# Patient Record
Sex: Female | Born: 1935 | Race: White | Hispanic: No | Smoking: Former smoker
Health system: Southern US, Community
[De-identification: ages and names within clinical notes are randomized; demographics above are authoritative.]

## PROBLEM LIST (undated history)

## (undated) DIAGNOSIS — M810 Age-related osteoporosis without current pathological fracture: Secondary | ICD-10-CM

## (undated) DIAGNOSIS — M545 Low back pain, unspecified: Secondary | ICD-10-CM

## (undated) DIAGNOSIS — E785 Hyperlipidemia, unspecified: Secondary | ICD-10-CM

## (undated) DIAGNOSIS — F419 Anxiety disorder, unspecified: Secondary | ICD-10-CM

## (undated) DIAGNOSIS — I10 Essential (primary) hypertension: Secondary | ICD-10-CM

## (undated) DIAGNOSIS — F32A Depression, unspecified: Secondary | ICD-10-CM

## (undated) DIAGNOSIS — D649 Anemia, unspecified: Secondary | ICD-10-CM

## (undated) DIAGNOSIS — Z950 Presence of cardiac pacemaker: Secondary | ICD-10-CM

## (undated) DIAGNOSIS — E079 Disorder of thyroid, unspecified: Secondary | ICD-10-CM

## (undated) DIAGNOSIS — F329 Major depressive disorder, single episode, unspecified: Secondary | ICD-10-CM

## (undated) DIAGNOSIS — I509 Heart failure, unspecified: Secondary | ICD-10-CM

## (undated) DIAGNOSIS — I350 Nonrheumatic aortic (valve) stenosis: Secondary | ICD-10-CM

## (undated) HISTORY — PX: OTHER SURGICAL HISTORY: SHX169

## (undated) HISTORY — PX: TOTAL HIP ARTHROPLASTY: SHX124

## (undated) HISTORY — PX: PACEMAKER INSERTION: SHX728

## (undated) HISTORY — PX: INSERT / REPLACE / REMOVE PACEMAKER: SUR710

---

## 2002-06-05 ENCOUNTER — Encounter: Payer: Self-pay | Admitting: Internal Medicine

## 2002-06-05 ENCOUNTER — Ambulatory Visit (HOSPITAL_COMMUNITY): Admission: RE | Admit: 2002-06-05 | Discharge: 2002-06-05 | Payer: Self-pay | Admitting: Internal Medicine

## 2003-07-10 ENCOUNTER — Encounter: Payer: Self-pay | Admitting: Internal Medicine

## 2003-07-10 ENCOUNTER — Ambulatory Visit (HOSPITAL_COMMUNITY): Admission: RE | Admit: 2003-07-10 | Discharge: 2003-07-10 | Payer: Self-pay | Admitting: Internal Medicine

## 2004-12-23 ENCOUNTER — Ambulatory Visit: Payer: Self-pay | Admitting: Internal Medicine

## 2005-06-23 ENCOUNTER — Ambulatory Visit: Payer: Self-pay | Admitting: Internal Medicine

## 2005-10-05 ENCOUNTER — Ambulatory Visit: Payer: Self-pay | Admitting: Internal Medicine

## 2006-03-29 ENCOUNTER — Ambulatory Visit: Payer: Self-pay | Admitting: Internal Medicine

## 2006-09-20 ENCOUNTER — Ambulatory Visit: Payer: Self-pay | Admitting: Internal Medicine

## 2007-01-21 ENCOUNTER — Inpatient Hospital Stay (HOSPITAL_COMMUNITY): Admission: EM | Admit: 2007-01-21 | Discharge: 2007-01-25 | Payer: Self-pay | Admitting: Emergency Medicine

## 2007-01-21 ENCOUNTER — Encounter: Payer: Self-pay | Admitting: Cardiology

## 2007-01-21 ENCOUNTER — Ambulatory Visit: Payer: Self-pay | Admitting: Cardiology

## 2007-02-21 ENCOUNTER — Inpatient Hospital Stay (HOSPITAL_COMMUNITY): Admission: EM | Admit: 2007-02-21 | Discharge: 2007-02-24 | Payer: Self-pay | Admitting: Emergency Medicine

## 2007-02-21 ENCOUNTER — Ambulatory Visit: Payer: Self-pay | Admitting: Cardiology

## 2007-02-22 ENCOUNTER — Ambulatory Visit: Payer: Self-pay | Admitting: Vascular Surgery

## 2007-03-06 ENCOUNTER — Ambulatory Visit: Payer: Self-pay | Admitting: Internal Medicine

## 2007-03-28 ENCOUNTER — Ambulatory Visit: Payer: Self-pay | Admitting: Cardiovascular Disease

## 2007-07-22 DIAGNOSIS — E039 Hypothyroidism, unspecified: Secondary | ICD-10-CM | POA: Insufficient documentation

## 2007-07-22 DIAGNOSIS — H4089 Other specified glaucoma: Secondary | ICD-10-CM

## 2007-07-22 DIAGNOSIS — Z9189 Other specified personal risk factors, not elsewhere classified: Secondary | ICD-10-CM | POA: Insufficient documentation

## 2007-07-22 DIAGNOSIS — F411 Generalized anxiety disorder: Secondary | ICD-10-CM

## 2007-07-22 DIAGNOSIS — I10 Essential (primary) hypertension: Secondary | ICD-10-CM | POA: Insufficient documentation

## 2007-07-22 DIAGNOSIS — M810 Age-related osteoporosis without current pathological fracture: Secondary | ICD-10-CM | POA: Insufficient documentation

## 2007-07-22 DIAGNOSIS — R55 Syncope and collapse: Secondary | ICD-10-CM

## 2007-09-06 ENCOUNTER — Ambulatory Visit: Payer: Self-pay | Admitting: Internal Medicine

## 2007-09-06 LAB — CONVERTED CEMR LAB: TSH: 2.21 microintl units/mL (ref 0.35–5.50)

## 2008-02-23 ENCOUNTER — Telehealth: Payer: Self-pay | Admitting: Internal Medicine

## 2008-04-09 ENCOUNTER — Ambulatory Visit: Payer: Self-pay | Admitting: Internal Medicine

## 2008-04-09 DIAGNOSIS — R5381 Other malaise: Secondary | ICD-10-CM

## 2008-04-09 DIAGNOSIS — R5383 Other fatigue: Secondary | ICD-10-CM

## 2008-04-09 DIAGNOSIS — D649 Anemia, unspecified: Secondary | ICD-10-CM

## 2008-04-09 DIAGNOSIS — G4700 Insomnia, unspecified: Secondary | ICD-10-CM

## 2008-04-10 ENCOUNTER — Encounter: Payer: Self-pay | Admitting: Internal Medicine

## 2008-04-10 DIAGNOSIS — E538 Deficiency of other specified B group vitamins: Secondary | ICD-10-CM

## 2008-04-10 LAB — CONVERTED CEMR LAB
ALT: 22 units/L (ref 0–35)
AST: 24 units/L (ref 0–37)
Albumin: 4.1 g/dL (ref 3.5–5.2)
BUN: 11 mg/dL (ref 6–23)
Bilirubin, Direct: 0.1 mg/dL (ref 0.0–0.3)
Chloride: 103 meq/L (ref 96–112)
Creatinine, Ser: 0.6 mg/dL (ref 0.4–1.2)
Eosinophils Relative: 0.9 % (ref 0.0–5.0)
Folate: >20 ng/mL
Hemoglobin: 13.9 g/dL (ref 12.0–15.0)
LDL Cholesterol: 83 mg/dL (ref 0–99)
MCV: 92.8 fL (ref 78.0–100.0)
Neutrophils Relative %: 76.7 % (ref 43.0–77.0)
Platelets: 199 10*3/uL (ref 150–400)
RBC: 4.49 M/uL (ref 3.87–5.11)
RDW: 12.4 % (ref 11.5–14.6)
Saturation Ratios: 20 % (ref 20.0–50.0)
TSH: 1.71 microintl units/mL (ref 0.35–5.50)
Total Bilirubin: 0.6 mg/dL (ref 0.3–1.2)
Total CHOL/HDL Ratio: 2.4
Total Protein: 7.3 g/dL (ref 6.0–8.3)
Transferrin: 321.7 mg/dL (ref >=212.0)

## 2008-04-12 ENCOUNTER — Telehealth: Payer: Self-pay | Admitting: Internal Medicine

## 2008-06-25 ENCOUNTER — Encounter: Payer: Self-pay | Admitting: Internal Medicine

## 2008-08-15 ENCOUNTER — Telehealth (INDEPENDENT_AMBULATORY_CARE_PROVIDER_SITE_OTHER): Payer: Self-pay | Admitting: *Deleted

## 2008-10-03 ENCOUNTER — Telehealth (INDEPENDENT_AMBULATORY_CARE_PROVIDER_SITE_OTHER): Payer: Self-pay | Admitting: *Deleted

## 2008-10-08 ENCOUNTER — Ambulatory Visit: Payer: Self-pay | Admitting: Internal Medicine

## 2009-01-24 ENCOUNTER — Telehealth (INDEPENDENT_AMBULATORY_CARE_PROVIDER_SITE_OTHER): Payer: Self-pay | Admitting: *Deleted

## 2009-03-24 ENCOUNTER — Telehealth (INDEPENDENT_AMBULATORY_CARE_PROVIDER_SITE_OTHER): Payer: Self-pay | Admitting: *Deleted

## 2009-04-08 ENCOUNTER — Ambulatory Visit: Payer: Self-pay | Admitting: Internal Medicine

## 2009-04-08 LAB — CONVERTED CEMR LAB
Alkaline Phosphatase: 80 units/L (ref 39–117)
BUN: 9 mg/dL (ref 6–23)
Basophils Relative: 0.2 % (ref 0.0–3.0)
CO2: 32 meq/L (ref 19–32)
Calcium: 9.7 mg/dL (ref 8.4–10.5)
Chloride: 102 meq/L (ref 96–112)
Creatinine, Ser: 0.6 mg/dL (ref 0.4–1.2)
Glucose, Bld: 113 mg/dL — ABNORMAL HIGH (ref 70–99)
HCT: 40.3 % (ref 36.0–46.0)
Hemoglobin, Urine: NEGATIVE
Hemoglobin: 14 g/dL (ref 12.0–15.0)
Leukocytes, UA: NEGATIVE
MCHC: 34.8 g/dL (ref 30.0–36.0)
Nitrite: NEGATIVE
Platelets: 188 10*3/uL (ref 150.0–400.0)
Potassium: 3.5 meq/L (ref 3.5–5.1)
RBC: 4.43 M/uL (ref 3.87–5.11)
Sodium: 142 meq/L (ref 135–145)
Total Bilirubin: 0.9 mg/dL (ref 0.3–1.2)
Total CHOL/HDL Ratio: 2
Triglycerides: 48 mg/dL (ref 0.0–149.0)
Urine Glucose: NEGATIVE mg/dL
Urobilinogen, UA: 0.2 (ref 0.0–1.0)
VLDL: 9.6 mg/dL (ref 0.0–40.0)
WBC: 8.9 10*3/uL (ref 4.5–10.5)

## 2009-07-16 ENCOUNTER — Ambulatory Visit: Payer: Self-pay | Admitting: Endocrinology

## 2009-07-16 ENCOUNTER — Ambulatory Visit: Payer: Self-pay | Admitting: Cardiology

## 2009-07-16 ENCOUNTER — Inpatient Hospital Stay (HOSPITAL_COMMUNITY): Admission: EM | Admit: 2009-07-16 | Discharge: 2009-07-21 | Payer: Self-pay | Admitting: Emergency Medicine

## 2009-07-17 ENCOUNTER — Encounter (INDEPENDENT_AMBULATORY_CARE_PROVIDER_SITE_OTHER): Payer: Self-pay | Admitting: Internal Medicine

## 2009-07-18 ENCOUNTER — Encounter (INDEPENDENT_AMBULATORY_CARE_PROVIDER_SITE_OTHER): Payer: Self-pay | Admitting: Internal Medicine

## 2009-07-18 ENCOUNTER — Ambulatory Visit: Payer: Self-pay | Admitting: Vascular Surgery

## 2009-08-11 ENCOUNTER — Ambulatory Visit: Payer: Self-pay | Admitting: Internal Medicine

## 2009-08-11 DIAGNOSIS — R509 Fever, unspecified: Secondary | ICD-10-CM

## 2009-08-11 LAB — CONVERTED CEMR LAB
Hemoglobin, Urine: NEGATIVE
Ketones, ur: NEGATIVE mg/dL
Specific Gravity, Urine: 1.02 (ref 1.000–1.030)
Total Protein, Urine: NEGATIVE mg/dL
Urine Glucose: NEGATIVE mg/dL
Urobilinogen, UA: 0.2 (ref 0.0–1.0)
pH: 5.5 (ref 5.0–8.0)

## 2009-08-12 ENCOUNTER — Telehealth: Payer: Self-pay | Admitting: Internal Medicine

## 2009-08-12 ENCOUNTER — Encounter: Payer: Self-pay | Admitting: Internal Medicine

## 2009-08-15 ENCOUNTER — Telehealth: Payer: Self-pay | Admitting: Internal Medicine

## 2009-08-16 DIAGNOSIS — D638 Anemia in other chronic diseases classified elsewhere: Secondary | ICD-10-CM

## 2009-08-16 DIAGNOSIS — I5021 Acute systolic (congestive) heart failure: Secondary | ICD-10-CM | POA: Insufficient documentation

## 2009-08-16 DIAGNOSIS — I498 Other specified cardiac arrhythmias: Secondary | ICD-10-CM

## 2009-08-21 ENCOUNTER — Encounter: Payer: Self-pay | Admitting: Internal Medicine

## 2009-08-21 ENCOUNTER — Telehealth: Payer: Self-pay | Admitting: Internal Medicine

## 2009-08-25 ENCOUNTER — Ambulatory Visit: Payer: Self-pay | Admitting: Internal Medicine

## 2009-09-02 ENCOUNTER — Telehealth: Payer: Self-pay | Admitting: Internal Medicine

## 2009-09-15 ENCOUNTER — Telehealth: Payer: Self-pay | Admitting: Internal Medicine

## 2009-09-24 ENCOUNTER — Telehealth: Payer: Self-pay | Admitting: Internal Medicine

## 2009-09-29 ENCOUNTER — Encounter: Payer: Self-pay | Admitting: Cardiovascular Disease

## 2009-09-29 ENCOUNTER — Emergency Department (HOSPITAL_COMMUNITY): Admission: EM | Admit: 2009-09-29 | Discharge: 2009-09-29 | Payer: Self-pay | Admitting: Emergency Medicine

## 2009-09-30 ENCOUNTER — Telehealth: Payer: Self-pay | Admitting: Cardiovascular Disease

## 2009-10-06 ENCOUNTER — Inpatient Hospital Stay (HOSPITAL_COMMUNITY): Admission: EM | Admit: 2009-10-06 | Discharge: 2009-10-09 | Payer: Self-pay | Admitting: Emergency Medicine

## 2009-10-06 ENCOUNTER — Telehealth: Payer: Self-pay | Admitting: Internal Medicine

## 2009-10-10 ENCOUNTER — Telehealth: Payer: Self-pay | Admitting: Internal Medicine

## 2009-10-15 ENCOUNTER — Encounter (INDEPENDENT_AMBULATORY_CARE_PROVIDER_SITE_OTHER): Payer: Self-pay | Admitting: *Deleted

## 2009-10-16 ENCOUNTER — Ambulatory Visit: Payer: Self-pay | Admitting: Cardiovascular Disease

## 2009-10-16 ENCOUNTER — Ambulatory Visit: Payer: Self-pay | Admitting: Internal Medicine

## 2009-11-03 DIAGNOSIS — I359 Nonrheumatic aortic valve disorder, unspecified: Secondary | ICD-10-CM | POA: Insufficient documentation

## 2011-01-17 IMAGING — CR DG LUMBAR SPINE COMPLETE 4+V
7 series · 7 of 7 positions shown · non-contrast
Comparison: Chest radiograph 08/11/2009 and 09/29/2009

CLINICAL DATA: Syncope.  Lower back pain.

LUMBAR SPINE - COMPLETE 4+ VIEW

[t l-spine a.p.]
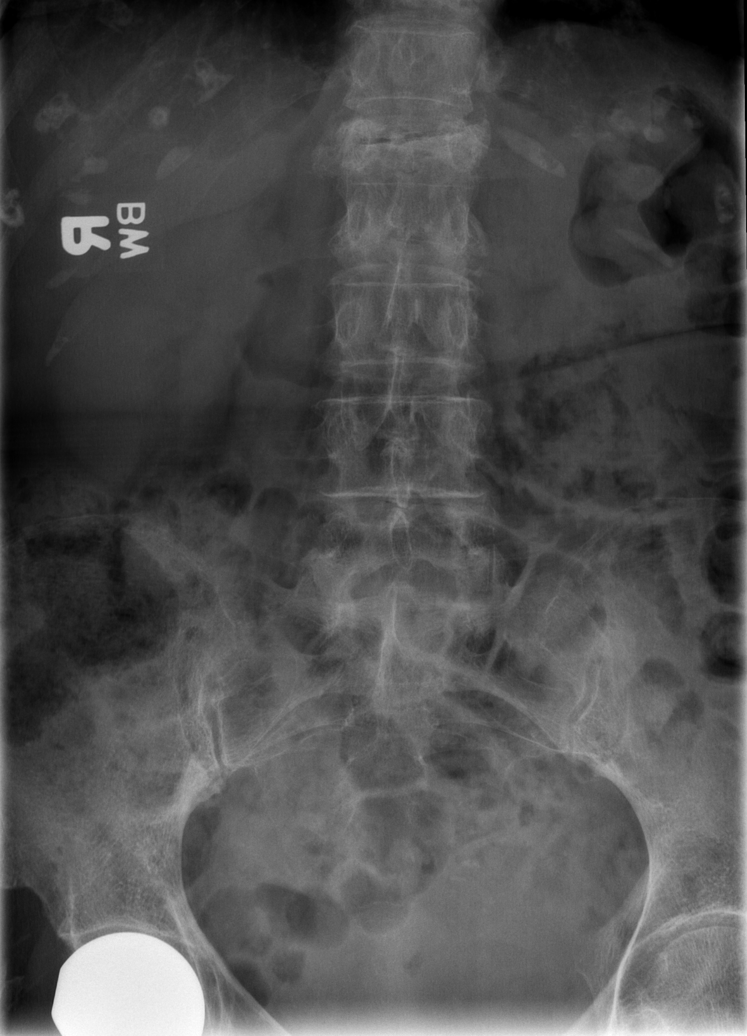

[t l-spine oblique exposure (1 of 3)]
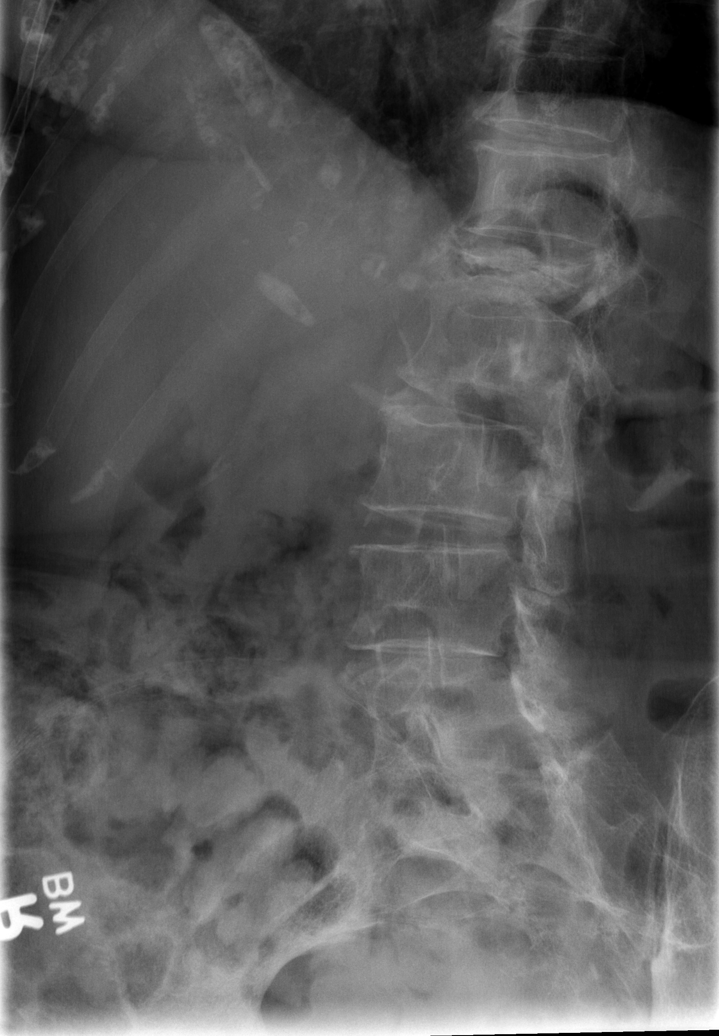

[t l-spine oblique exposure (2 of 3)]
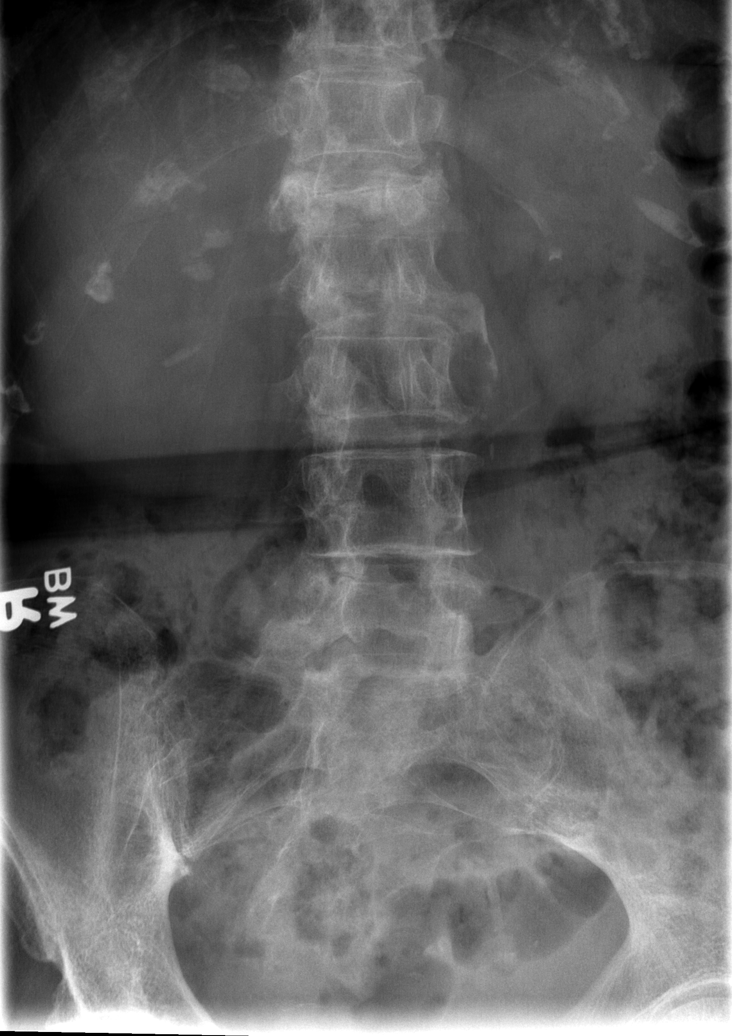

[t l-spine oblique exposure (3 of 3)]
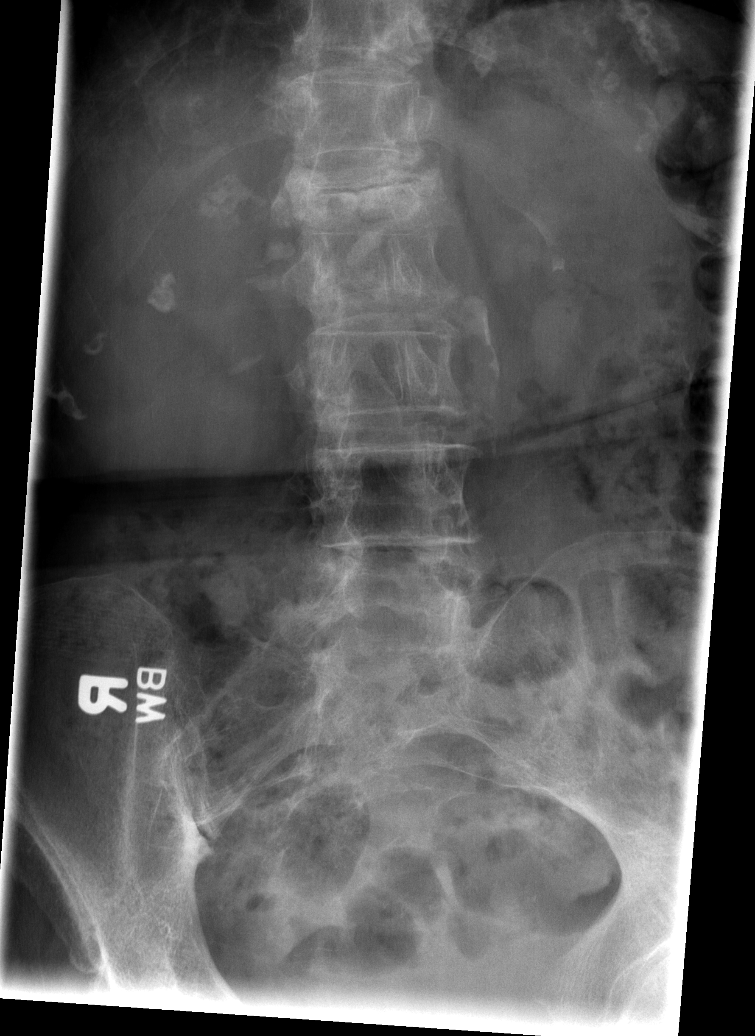

[t l-spine lat (1 of 2)]
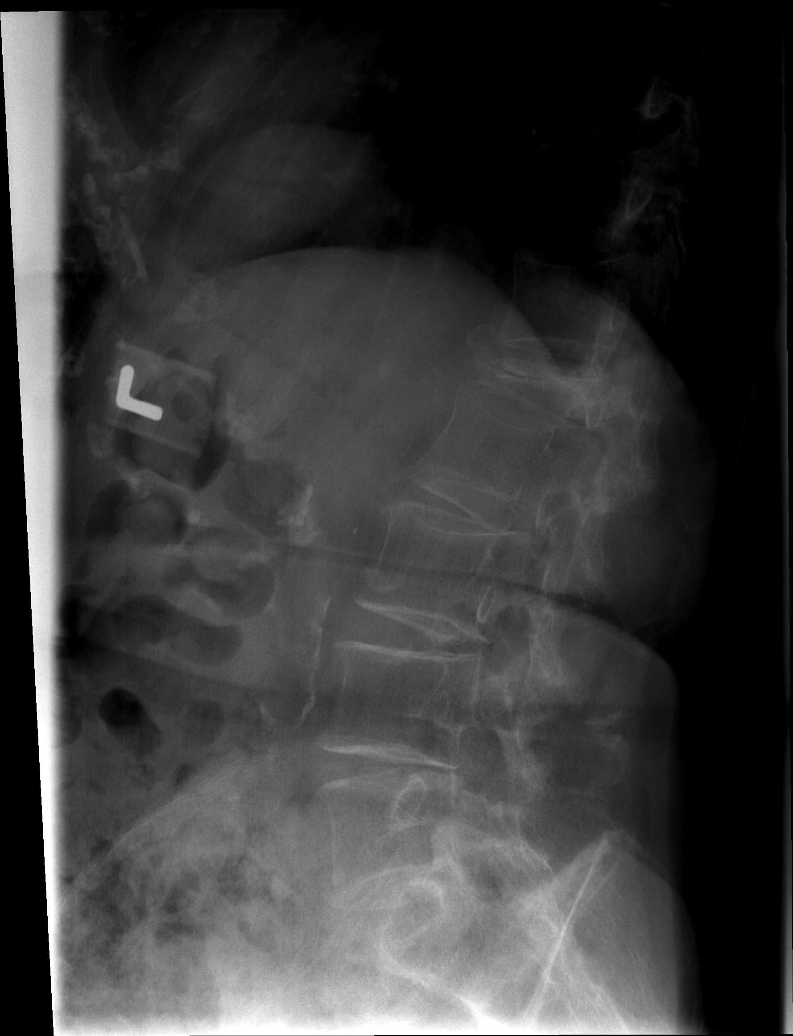

[t l-spine l5-s1 spot]
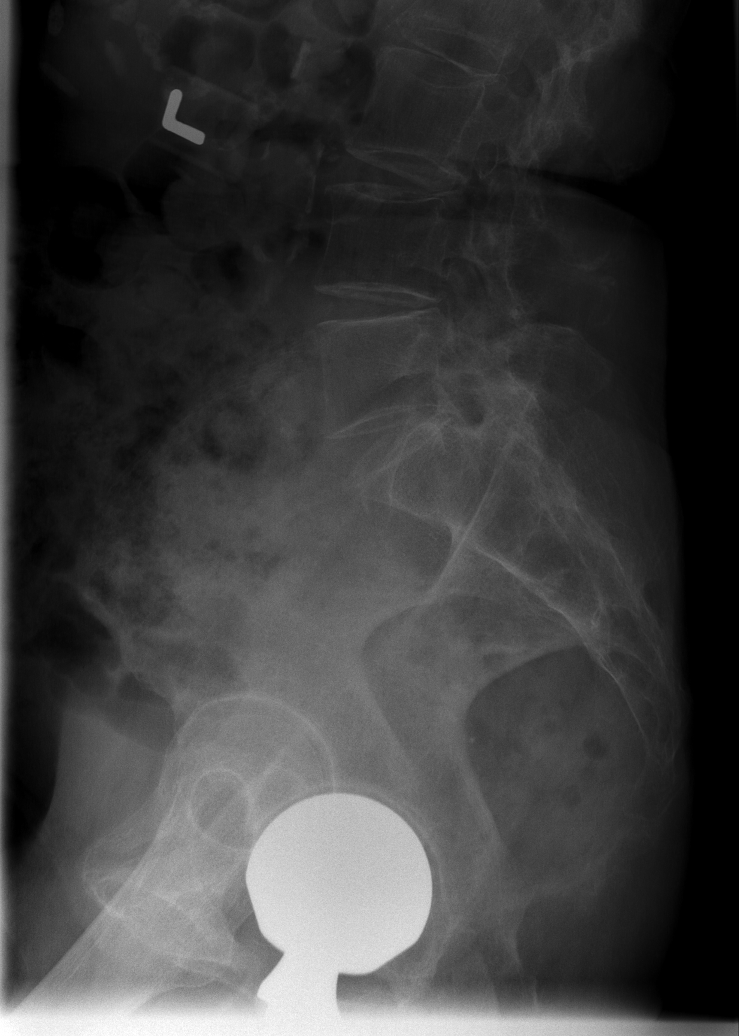

[t l-spine lat (2 of 2)]
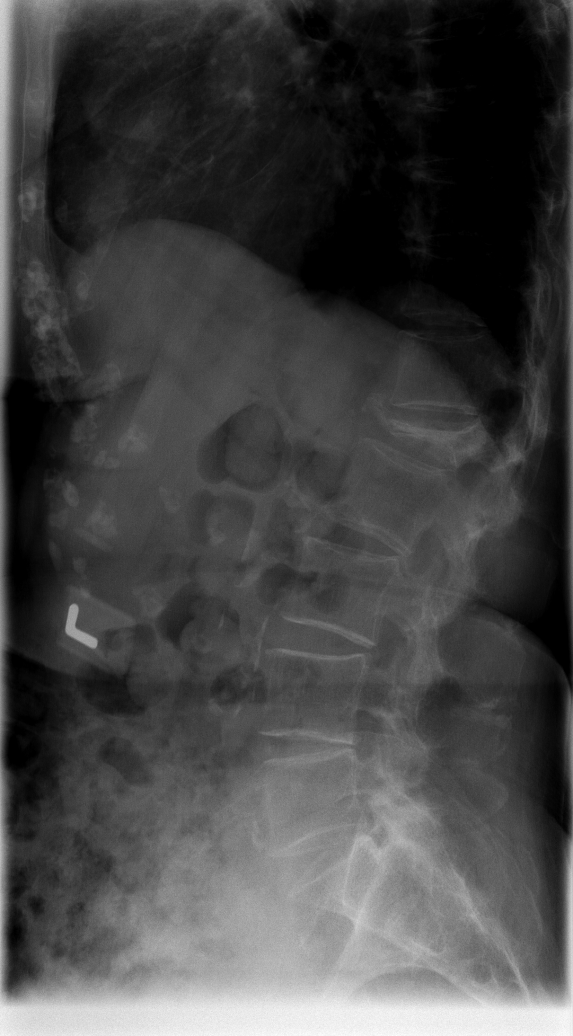

[7 of 7 positions shown; findings below may reference images not displayed]

FINDINGS: There are five lumbar-type vertebral bodies, and normal
alignment.  Bony mineralization is markedly decreased.  There is a
severe compression deformity of the L1 vertebral body, with
approximately 80-90% loss of vertebral body height.  This fracture
appears without interval change compared to the lateral view of the
chest radiograph 08/11/2009.  The remainder of the vertebral bodies
are normal in height.  There is mild disc space narrowing at L4-L5.

There is atherosclerotic calcification of the abdominal aorta.
There are mild facet joint degenerative changes at the L4-5 and L5-
S1.

Right hip arthroplasty is partially visualized.
IMPRESSION: 1.  Severe compression deformity of L1.  This appears similar
compared to a chest radiograph July 2009.
2.  Diffusely decreased bony mineralization.  Three
3.  Atherosclerotic calcification of the abdominal aorta.

## 2011-03-18 LAB — CBC
HCT: 34.5 % — ABNORMAL LOW (ref 36.0–46.0)
HCT: 36.5 % (ref 36.0–46.0)
HCT: 39 % (ref 36.0–46.0)
Hemoglobin: 11.7 g/dL — ABNORMAL LOW (ref 12.0–15.0)
Hemoglobin: 12.2 g/dL (ref 12.0–15.0)
Hemoglobin: 12.5 g/dL (ref 12.0–15.0)
Hemoglobin: 13 g/dL (ref 12.0–15.0)
MCHC: 33.2 g/dL (ref 30.0–36.0)
MCHC: 33.4 g/dL (ref 30.0–36.0)
MCHC: 33.5 g/dL (ref 30.0–36.0)
MCV: 90.6 fL (ref 78.0–100.0)
Platelets: 199 10*3/uL (ref 150–400)
RBC: 3.86 MIL/uL — ABNORMAL LOW (ref 3.87–5.11)
RBC: 4.16 MIL/uL (ref 3.87–5.11)
RDW: 15.2 % (ref 11.5–15.5)
RDW: 15.3 % (ref 11.5–15.5)
RDW: 15.4 % (ref 11.5–15.5)
RDW: 15.5 % (ref 11.5–15.5)
WBC: 10.3 10*3/uL (ref 4.0–10.5)
WBC: 12.4 10*3/uL — ABNORMAL HIGH (ref 4.0–10.5)
WBC: 14.9 10*3/uL — ABNORMAL HIGH (ref 4.0–10.5)

## 2011-03-18 LAB — BASIC METABOLIC PANEL
CO2: 26 mEq/L (ref 19–32)
Calcium: 8.7 mg/dL (ref 8.4–10.5)
Calcium: 9.3 mg/dL (ref 8.4–10.5)
Calcium: 9.6 mg/dL (ref 8.4–10.5)
Chloride: 100 mEq/L (ref 96–112)
Chloride: 97 mEq/L (ref 96–112)
Creatinine, Ser: 0.47 mg/dL (ref 0.4–1.2)
Creatinine, Ser: 0.61 mg/dL (ref 0.4–1.2)
GFR calc Af Amer: >60 mL/min (ref >60)
GFR calc Af Amer: >60 mL/min (ref >60)
GFR calc Af Amer: >60 mL/min (ref >60)
GFR calc Af Amer: >60 mL/min (ref >60)
GFR calc non Af Amer: >60 mL/min (ref >60)
GFR calc non Af Amer: >60 mL/min (ref >60)
GFR calc non Af Amer: >60 mL/min (ref >60)
GFR calc non Af Amer: >60 mL/min (ref >60)
Glucose, Bld: 108 mg/dL — ABNORMAL HIGH (ref 70–99)
Glucose, Bld: 124 mg/dL — ABNORMAL HIGH (ref 70–99)
Glucose, Bld: 165 mg/dL — ABNORMAL HIGH (ref 70–99)
Potassium: 3.7 mEq/L (ref 3.5–5.1)
Potassium: 3.7 mEq/L (ref 3.5–5.1)
Potassium: 4 mEq/L (ref 3.5–5.1)
Sodium: 131 mEq/L — ABNORMAL LOW (ref 135–145)
Sodium: 134 mEq/L — ABNORMAL LOW (ref 135–145)
Sodium: 134 mEq/L — ABNORMAL LOW (ref 135–145)
Sodium: 138 mEq/L (ref 135–145)

## 2011-03-18 LAB — DIFFERENTIAL
Basophils Absolute: 0 10*3/uL (ref 0.0–0.1)
Basophils Absolute: 0 10*3/uL (ref 0.0–0.1)
Basophils Absolute: 0 10*3/uL (ref 0.0–0.1)
Basophils Absolute: 0 10*3/uL (ref 0.0–0.1)
Basophils Absolute: 0 10*3/uL (ref 0.0–0.1)
Basophils Relative: 0 % (ref 0–1)
Basophils Relative: 0 % (ref 0–1)
Basophils Relative: 0 % (ref 0–1)
Basophils Relative: 0 % (ref 0–1)
Eosinophils Absolute: 0 10*3/uL (ref 0.0–0.7)
Eosinophils Relative: 0 % (ref 0–5)
Eosinophils Relative: 0 % (ref 0–5)
Eosinophils Relative: 2 % (ref 0–5)
Lymphocytes Relative: 3 % — ABNORMAL LOW (ref 12–46)
Lymphocytes Relative: 8 % — ABNORMAL LOW (ref 12–46)
Lymphs Abs: 0.3 10*3/uL — ABNORMAL LOW (ref 0.7–4.0)
Lymphs Abs: 0.9 10*3/uL (ref 0.7–4.0)
Monocytes Absolute: 1.3 10*3/uL — ABNORMAL HIGH (ref 0.1–1.0)
Monocytes Absolute: 1.3 10*3/uL — ABNORMAL HIGH (ref 0.1–1.0)
Monocytes Absolute: 1.3 10*3/uL — ABNORMAL HIGH (ref 0.1–1.0)
Monocytes Relative: 11 % (ref 3–12)
Monocytes Relative: 7 % (ref 3–12)
Monocytes Relative: 8 % (ref 3–12)
Neutro Abs: 10 10*3/uL — ABNORMAL HIGH (ref 1.7–7.7)
Neutro Abs: 14.6 10*3/uL — ABNORMAL HIGH (ref 1.7–7.7)
Neutro Abs: 8.5 10*3/uL — ABNORMAL HIGH (ref 1.7–7.7)
Neutrophils Relative %: 81 % — ABNORMAL HIGH (ref 43–77)
Neutrophils Relative %: 91 % — ABNORMAL HIGH (ref 43–77)

## 2011-03-18 LAB — COMPREHENSIVE METABOLIC PANEL
ALT: 17 U/L (ref 0–35)
Alkaline Phosphatase: 51 U/L (ref 39–117)
BUN: 7 mg/dL (ref 6–23)
Chloride: 97 mEq/L (ref 96–112)
Glucose, Bld: 97 mg/dL (ref 70–99)
Potassium: 3.2 mEq/L — ABNORMAL LOW (ref 3.5–5.1)
Sodium: 131 mEq/L — ABNORMAL LOW (ref 135–145)
Total Bilirubin: 0.9 mg/dL (ref 0.3–1.2)
Total Protein: 5.9 g/dL — ABNORMAL LOW (ref 6.0–8.3)

## 2011-03-18 LAB — POCT CARDIAC MARKERS
CKMB, poc: 1.1 ng/mL (ref 1.0–8.0)
CKMB, poc: 2.2 ng/mL (ref 1.0–8.0)
Myoglobin, poc: 134 ng/mL (ref 12–200)
Myoglobin, poc: 43 ng/mL (ref 12–200)

## 2011-03-18 LAB — URINE CULTURE

## 2011-03-18 LAB — URINALYSIS, ROUTINE W REFLEX MICROSCOPIC
Bilirubin Urine: NEGATIVE
Glucose, UA: NEGATIVE mg/dL
Hgb urine dipstick: NEGATIVE
Ketones, ur: NEGATIVE mg/dL
Protein, ur: NEGATIVE mg/dL
Urobilinogen, UA: 0.2 mg/dL (ref 0.0–1.0)

## 2011-03-18 LAB — TSH: TSH: 1.712 u[IU]/mL (ref 0.350–4.500)

## 2011-03-20 LAB — COMPREHENSIVE METABOLIC PANEL
ALT: 33 U/L (ref 0–35)
AST: 25 U/L (ref 0–37)
Albumin: 2.7 g/dL — ABNORMAL LOW (ref 3.5–5.2)
Albumin: 3.3 g/dL — ABNORMAL LOW (ref 3.5–5.2)
Alkaline Phosphatase: 61 U/L (ref 39–117)
BUN: 18 mg/dL (ref 6–23)
Calcium: 8.4 mg/dL (ref 8.4–10.5)
Creatinine, Ser: 0.82 mg/dL (ref 0.4–1.2)
GFR calc Af Amer: >60 mL/min (ref >60)
Glucose, Bld: 98 mg/dL (ref 70–99)
Potassium: 4 mEq/L (ref 3.5–5.1)
Sodium: 129 mEq/L — ABNORMAL LOW (ref 135–145)
Total Protein: 5.6 g/dL — ABNORMAL LOW (ref 6.0–8.3)
Total Protein: 6.4 g/dL (ref 6.0–8.3)

## 2011-03-20 LAB — TROPONIN I: Troponin I: 0.29 ng/mL — ABNORMAL HIGH (ref 0.00–0.06)

## 2011-03-20 LAB — CULTURE, BLOOD (ROUTINE X 2): Culture: NO GROWTH

## 2011-03-20 LAB — BASIC METABOLIC PANEL
BUN: 11 mg/dL (ref 6–23)
BUN: 11 mg/dL (ref 6–23)
BUN: 13 mg/dL (ref 6–23)
CO2: 28 mEq/L (ref 19–32)
CO2: 29 mEq/L (ref 19–32)
CO2: 29 mEq/L (ref 19–32)
CO2: 32 mEq/L (ref 19–32)
Calcium: 8.6 mg/dL (ref 8.4–10.5)
Calcium: 8.9 mg/dL (ref 8.4–10.5)
Chloride: 93 mEq/L — ABNORMAL LOW (ref 96–112)
Chloride: 96 mEq/L (ref 96–112)
GFR calc Af Amer: >60 mL/min (ref >60)
GFR calc non Af Amer: >60 mL/min (ref >60)
GFR calc non Af Amer: >60 mL/min (ref >60)
Glucose, Bld: 103 mg/dL — ABNORMAL HIGH (ref 70–99)
Glucose, Bld: 110 mg/dL — ABNORMAL HIGH (ref 70–99)
Glucose, Bld: 123 mg/dL — ABNORMAL HIGH (ref 70–99)
Glucose, Bld: 138 mg/dL — ABNORMAL HIGH (ref 70–99)
Potassium: 3.7 mEq/L (ref 3.5–5.1)
Potassium: 3.8 mEq/L (ref 3.5–5.1)
Sodium: 129 mEq/L — ABNORMAL LOW (ref 135–145)
Sodium: 129 mEq/L — ABNORMAL LOW (ref 135–145)
Sodium: 130 mEq/L — ABNORMAL LOW (ref 135–145)
Sodium: 130 mEq/L — ABNORMAL LOW (ref 135–145)

## 2011-03-20 LAB — URINE MICROSCOPIC-ADD ON

## 2011-03-20 LAB — CK TOTAL AND CKMB (NOT AT ARMC)
CK, MB: 2.6 ng/mL (ref 0.3–4.0)
Relative Index: INVALID (ref 0.0–2.5)
Total CK: 85 U/L (ref 7–177)

## 2011-03-20 LAB — POCT CARDIAC MARKERS
CKMB, poc: <1 ng/mL — ABNORMAL LOW (ref 1.0–8.0)
Troponin i, poc: <0.05 ng/mL (ref 0.00–0.09)

## 2011-03-20 LAB — CBC
HCT: 34.9 % — ABNORMAL LOW (ref 36.0–46.0)
HCT: 36.4 % (ref 36.0–46.0)
HCT: 37.2 % (ref 36.0–46.0)
HCT: 39 % (ref 36.0–46.0)
Hemoglobin: 10.8 g/dL — ABNORMAL LOW (ref 12.0–15.0)
Hemoglobin: 12.4 g/dL (ref 12.0–15.0)
Hemoglobin: 12.6 g/dL (ref 12.0–15.0)
Hemoglobin: 13.2 g/dL (ref 12.0–15.0)
MCHC: 33.7 g/dL (ref 30.0–36.0)
MCHC: 34 g/dL (ref 30.0–36.0)
MCV: 90.2 fL (ref 78.0–100.0)
MCV: 90.4 fL (ref 78.0–100.0)
MCV: 91.8 fL (ref 78.0–100.0)
Platelets: 143 10*3/uL — ABNORMAL LOW (ref 150–400)
Platelets: 161 10*3/uL (ref 150–400)
RBC: 4.12 MIL/uL (ref 3.87–5.11)
RBC: 4.29 MIL/uL (ref 3.87–5.11)
RDW: 14.2 % (ref 11.5–15.5)
RDW: 14.5 % (ref 11.5–15.5)
RDW: 14.6 % (ref 11.5–15.5)

## 2011-03-20 LAB — DIFFERENTIAL
Lymphocytes Relative: 2 % — ABNORMAL LOW (ref 12–46)
Monocytes Absolute: 1.7 10*3/uL — ABNORMAL HIGH (ref 0.1–1.0)
Monocytes Relative: 9 % (ref 3–12)
Neutro Abs: 16.6 10*3/uL — ABNORMAL HIGH (ref 1.7–7.7)

## 2011-03-20 LAB — CARDIAC PANEL(CRET KIN+CKTOT+MB+TROPI)
CK, MB: 1.7 ng/mL (ref 0.3–4.0)
CK, MB: 2.7 ng/mL (ref 0.3–4.0)
Relative Index: 2 (ref 0.0–2.5)
Relative Index: INVALID (ref 0.0–2.5)
Total CK: 43 U/L (ref 7–177)
Total CK: 86 U/L (ref 7–177)

## 2011-03-20 LAB — URINALYSIS, ROUTINE W REFLEX MICROSCOPIC
Bilirubin Urine: NEGATIVE
Hgb urine dipstick: NEGATIVE
Protein, ur: 30 mg/dL — AB
Specific Gravity, Urine: 1.024 (ref 1.005–1.030)
Urobilinogen, UA: 1 mg/dL (ref 0.0–1.0)

## 2011-03-20 LAB — TSH: TSH: 4.329 u[IU]/mL (ref 0.350–4.500)

## 2011-04-27 NOTE — H&P (Signed)
[name], [name]                ACCOUNT NO.:  [id number]   MEDICAL RECORD NO.:  [id number]          PATIENT TYPE:  INP   LOCATION:  2604                         FACILITY:  [facility]   PHYSICIAN:  Lonia Blood, M.D.       DATE OF BIRTH:  01/07/1914   DATE OF ADMISSION:  07/16/2009  DATE OF DISCHARGE:                              HISTORY & PHYSICAL   PRIMARY CARE PHYSICIAN:  Corwin Levins, MD with [facility].   CHIEF COMPLAINT:  Fall.   HISTORY OF PRESENT ILLNESS:  [name] is a 77 year old woman with  history of hypertension who had a mechanical fall 2 days ago followed  with some back pain and then had another fall this morning with  resultant right hip severe pain.  She had to be brought to the emergency  room via EMS because of her severe pain.  The patient reports that she  has been having increasing coughing to the night and difficulty with  breathing.  She denies any fevers, chills, or night sweats.   PAST MEDICAL HISTORY:  1. Moderate aortic stenosis.  2. Hypertension.  3. Hyperlipidemia.  4. Hypothyroidism.  5. Glaucoma.  6. Anemia of chronic disease.  7. Osteoporosis.  8. Anxiety.   PAST SURGICAL HISTORY:  Right total hip replacement in February 2008,  partial thyroidectomy for goiter in the 66s.   SOCIAL HISTORY:  The patient is a widow since 2002, never smoked  cigarettes, lives alone, and still is independent with IADLs.  She does  not drive, but she lives at walking distance from the pharmacy and the  grocery store.   FAMILY HISTORY:  The patient mother died with hypertension, brother died  with hypertension, father died with pneumonia.   HOME MEDICATIONS:  1. Levoxyl 50 mcg daily.  2. Alprazolam 0.5 mg twice daily as needed for anxiety.  3. Lisinopril 20 mg twice a day.  4. Norvasc 5 mg daily.  5. Metoprolol 25 mg twice a day.  6. Ambien 5 mg at bedtime as needed for sleep.  7. Celexa 10 mg daily.   REVIEW OF SYSTEMS:  As per HPI, also  positive for the right hip pain.  The patient denies any dysuria or urinary frequency.  She has a skin  tear on the left elbow from one of the falls.   PHYSICAL EXAMINATION UPON ADMISSION:  VITAL SIGNS:  Temperature is  100.2, heart rate 66, respiratory rate 15, blood pressure 130/60  initial, oxygen saturation 73% on room air and then the patient is 92%  on 4 liters of oxygen.  She has erythema of the face, which the daughter  tells me it is chronic, so I do not need to worry about.  HEENT:  Eyes have pupils are equal, round, and reactive to light and  accommodation.  Extraocular movements are intact.  NECK:  Without masses.  Throat is clear.  CHEST:  Some right basilar crackles.  No increased work of breathing.  No accessory muscle use.  No rhonchi or wheezes.  HEART:  Loud systolic murmur 4/6 at the right  upper sternal border.  Carotid pulses are diminished.  ABDOMEN:  Soft, nontender, and nondistended.  Bowel sounds are present.  LOWER EXTREMITIES:  With +1 edema.   LABORATORY VALUES ON ADMISSION:  Sodium 128, potassium 4.6, chloride 96,  bicarbonate 23, BUN 18, creatinine 0.82, glucose 138, AST 48, ALT 38,  troponin I less than 0.05.  White blood cell count is 18.6, hemoglobin  11.7, platelet count is 143.   Right hip x-ray is negative for dislocation of the prosthesis.  I  personally reviewed the chest x-ray, which shows right basilar airspace  opacity, question right pleural effusion and atelectasis, right and  left.  EKG shows normal sinus rhythm with questionable ST depression in  V4, but otherwise without any significant ST or T abnormalities.   ASSESSMENT AND PLAN:  1. This is a 77 year old woman getting admitted with a right lower      lobe pneumonia, probable urinary tract infection.  Plan is to place      Ms. Melchior a step-down unit due to her hyponatremia and severe      hypoxia on admission.  She will be started on intravenous Rocephin      and Zithromax with close  monitoring of her respiratory status.      Blood cultures and urine cultures have been sent.  2. Hyponatremia.  I wonder if this is inappropriate antidiuretic      hormone hypersecretion secondary to the pain and respiratory      problem.  We will hold the Celexa and follow up basic metabolic      profile in the morning.  I will not give this patient any IV fluids      for now as she is not obviously volume depleted and she has      moderate aortic stenosis, so she is at high risk of pulmonary      edema.  3. Right hip pain with probable contusion.  I will follow closely the      situation and proceed with a CT scan of the hip.  If we fail, that      situation warrants.  4. Mildly elevated liver function testing.  I do suspect this is for      secondary to pneumonia.  We will check an abdominal ultrasound and      follow closely.  5. Hypertension.  I do suspect that in this acute illness setting, the      patient will not need many blood pressure medications.  I will      continue her only on the beta-blocker and hold the ACE inhibitor      and the Norvasc.  6. Code status.  The patient wants to be full code.  7. Deep venous thrombosis prophylaxis, will be then using Lovenox.     Lonia Blood, M.D.  Electronically Signed    SL/MEDQ  D:  07/16/2009  T:  07/17/2009  Job:  [id number]   cc:   Corwin Levins, MD

## 2011-04-27 NOTE — H&P (Signed)
[name], [name]                ACCOUNT NO.:  [id number]   MEDICAL RECORD NO.:  [id number]          PATIENT TYPE:  INP   LOCATION:  2604                         FACILITY:  [facility]   PHYSICIAN:  [name], M.D.       DATE OF BIRTH:  01/07/1914   DATE OF ADMISSION:  07/16/2009  DATE OF DISCHARGE:                              HISTORY & PHYSICAL   CHIEF COMPLAINT:  Fall.   HISTORY OF PRESENT ILLNESS:  [name] is a 77 year old female who had  a mechanical fall 2 days ago.  She had some back pain following that  fall which seemed to resolve today.  This morning she got up to make  herself breakfast and suffered another mechanical fall which she  attributes to weakness.  She could not get up off of the floor and  therefore pressed her lifeline alert.  EMS was called as well as her  daughter.  She was brought to the [facility] emergency department where  x-ray reveals a new right lower lobe pneumonia.  We were asked to admit  the patient for further eval and treatment.  The daughter is present at  the time of admission.   ALLERGIES:  No known drug allergies.   PAST MEDICAL HISTORY:  1. Moderate aortic stenosis.  2. Hypertension.  3. Hyperlipidemia.  4. Hypothyroid.  5. Glaucoma.  6. Anemia - B12 deficiency.  7. Osteoporosis.  8. Anxiety.   PAST SURGICAL HISTORY:  1. Right total hip replacement January 23, 2007.  2. Partial thyroidectomy for goiter back in 1940s.   SOCIAL HISTORY:  She is widowed since 2002.  She never smoked occasional  wine, she lives alone.  She is a retired [employer] photographer.   FAMILY HISTORY:  Her mother is deceased, had history of hypertension,  cause of death unknown.  Her brother is also deceased with history of  hypertension and cause of death unknown.  Her father deceased secondary  to pneumonia.   MEDICATIONS:  1. Levoxyl 50 mcg p.o. daily.  2. Alprazolam 0.5 mg one half to one tablet p.o. b.i.d. as needed for      anxiety.  3.  Lisinopril 20 mg p.o. b.i.d.  4. Norvasc 5 mg p.o. daily.  5. Metoprolol 25 mg p.o. b.i.d.  6. Ambien 5 mg p.o. at bedtime as needed for sleep.  7. Citalopram 10 mg p.o. daily.   REVIEW OF SYSTEMS:  GENERAL:  She denies fever, chills, does note  generalized weakness.  CARDIOVASCULAR:  Denies chest pain or  palpitations.  RESPIRATORY:  Denies shortness of breath.  She did have a  cough last night which is nonproductive in nature.  GI/ABDOMINAL:  Denies abdominal pain, nausea, vomiting or diarrhea.  PSYCHIATRIC:  She  notes some mild anxiety.  Denies depression at this time.  GU:  She  denies dysuria or hematuria.  NEURO:  Denies blurred vision or numbness.  MUSCULOSKELETAL:  Has right hip pain.  SKIN:  Has scraped left elbow.  She also has a nodule about the right clavicle which she states has been  present for a long time.  LYMPH:  Denies lymphadenopathy.   LABORATORY DATA/RADIOLOGY:  Sodium 128, potassium 4.6, chloride 96,  bicarb 23, BUN 18, creatinine 0.82, glucose 138, AST 48, ALT 38, CK-MB  less than 1, troponin less than 0.05, myoglobin 173.  Hemoglobin 11.7,  hematocrit 34.9, white [name] cell count 18.6, platelets 143.  Right hip x-ray is negative for fracture dislocation.  Chest x-ray  cardiomegaly, right basilar airspace opacity likely pneumonia,  atelectasis/pulmonary contusions are less likely consideration.  Probable right pleural effusion, mild left lower lobe atelectasis.  X-  ray of left elbow is negative.   PHYSICAL EXAMINATION:  VITAL SIGNS:  BP 130/60, heart rate 66,  respiratory rate 15, temperature 100.2, O2 sat 92% on 4 liters.  Please  note, O2 sat was 73% on room air at the time of admission.  GENERAL:  Elderly white female appears younger than stated age.  No  acute distress.  NECK:  No masses.  CARDIOVASCULAR:  S1-S2, loud systolic murmur of AS grade 3/6.  RESPIRATORY:  Bibasilar crackles right greater than left, no increased  work of breathing.  ABDOMEN:   Soft, nontender, nondistended.  Positive bowel sounds are  noted.  EXTREMITIES:  No peripheral edema.  PSYCHIATRIC:  She is alert and oriented x3.  She is calm and pleasant.  SKIN:  She has a skin nodule approximately 1 cm above the right clavicle  which is old according to the patient.  She also has a left elbow  dressing covering scrape of the left elbow which is clean, dry and  intact.  NEURO:  Her speech is clear.  She is moving all extremities.  She has  positive facial symmetry.   ASSESSMENT AND PLAN:  1. Status post mechanical fall in setting of right lower lobe      pneumonia and questionable urinary tract infection.  We will check      [name] cultures x2 as well as sputum culture.  We will also check      urine for Streptococcus pneumoniae urinary antigen.  Culture urine.      Start intravenous Rocephin and azithromycin.  Continue oxygen      support secondary to hypoxia and check urine culture.  2. Hyponatremia.  Plan gentle hydration intravenous with normal      saline.  Hold citalopram at this time as this may be contributing.      We will plan to repeat sodium in a.m.  3. Right hip pain x-ray is negative.  Plan to add Vicodin p.r.n.  May      require a CT versus MRI if pain does not improve.  We will request      a Physical Therapy and Occupational Therapy evaluation.  4. Elevated liver function tests.  We will check abdominal ultrasound      to rule out cholecystitis, so these changes are likely reactive to  status post mechanical fall in setting of right lower lobe pneumonia.  Plan to repeat in a.m.  1. Abnormal EKG.  She has new ST depressions in leads II, V4, V5 and      V6.  Denies chest pain or shortness of breath.  Point of care      enzymes are negative but we will plan to monitor on telemetry and      cycle cardiac enzymes.  2. History of hypothyroid.  Continue supplementation, check TSH.  3. Hypertension.  [name] pressure is stable.  Continue beta blocker but  hold lisinopril and Norvasc for now.  4. History of osteoporosis.  Plan for outpatient followup.  5. History of B12 deficiency, hemoglobin 11.7, outpatient followup.  6. Anxiety hold citalopram secondary to hyponatremia.  7. History of moderate aortic stenosis clinically compensated.  Plan      to monitor volume status closely.  8. History of glaucoma.  Plan for outpatient followup.  9. Hyperlipidemia.  Her lipids were stable on April 08, 2009 continue      heart healthy diet.  10.Code status.  This was discussed with the patient and her daughter.      The patient requests full code.      Sandford Craze, NP      [name], M.D.  Electronically Signed    MO/MEDQ  D:  07/16/2009  T:  07/16/2009  Job:  [id number]   cc:   Corwin Levins, MD

## 2011-04-27 NOTE — Discharge Summary (Signed)
NAMEJENETTE, [name]                ACCOUNT NO.:  [id number]   MEDICAL RECORD NO.:  [id number]          PATIENT TYPE:  INP   LOCATION:  5505                         FACILITY:  [facility]   PHYSICIAN:  Lonia Blood, M.D.       DATE OF BIRTH:  01/07/1914   DATE OF ADMISSION:  07/16/2009  DATE OF DISCHARGE:                               DISCHARGE SUMMARY   </DATE OF DISCHARGE>  July 21, 2009.   PRIMARY CARE PHYSICIAN:  Corwin Levins, MD.   PRIMARY CARDIOLOGIST:  [name]. Excell Seltzer, MD.   DISCHARGE DIAGNOSES:  1. Community acquired pneumonia.  2. Status post fall with right hip contusion.  3. Moderate to severe aortic stenosis.  4. Acute on chronic diastolic congestive heart failure.  5. Sinus bradycardia.  6. Brief episode of supraventricular tachycardia too short lived for      any interventions.  7. Probable emphysema.  8. Hyponatremia felt to be combination of syndrome of inappropriate      antidiuretic hormone production and CHF volume overload.  9. Hypothyroidism.  10.Glaucoma.  11.Anemia of chronic disease.  12.Osteoporosis.  13.Depression and anxiety.   DISCHARGE MEDICATIONS:  1. Avelox 400 mg daily for 2 more days.  2. Lasix 40 mg daily.  3. Potassium chloride 20 mEq daily.  4. Levothyroxine 50 mcg daily.  5. Remeron 15 mg at bedtime.  6. Xanax 0.5 mg by mouth twice a day as needed for anxiety.  7. Vicodin 1 tablet every 4 hours as needed for severe pain.   CONDITION ON DISCHARGE:  [name] was expected to be transferred to a  skilled nursing facility on July 21, 2009.  She is currently with  stable vital signs and afebrile.  The patient will need to follow up  with her primary cardiologist, Dr. Tonny Bollman, for discussion of  progression of her aortic stenosis and to see if she becomes a candidate  for surgical intervention versus percutaneous intervention.   PROCEDURES:  1. This admission the patient underwent a transthoracic echocardiogram      on July 17, 2009.  Findings of ejection fraction of the left      ventricle 60-65%, aortic valve area 0.6 sq cm which indicates      progression of the known moderate aortic stenosis.  The patient's      pulmonary artery pressure is elevated at 55 mmHg.  2. Abdominal ultrasound - no acute findings.   CONSULTATIONS:  The patient was seen by the [facility] group.   HISTORY AND PHYSICAL:  Refer to the dictated H and P done on July 16, 2009, by Lonia Blood, M.D.   HOSPITAL COURSE:  [name] is a 77 year old woman with known aortic  stenosis who presented to the emergency room after a fall.  She was also  noted to be having a low-grade fever, leukocytosis and hyponatremia.  Complete evaluation indicated a right lower lobe pneumonia.  [name]  was admitted to stepdown unit and she was started on intravenous  antibiotics.  Due to the patient's known aortic stenosis and perceived  volume  overload status we did not give her intravenous fluids.  Even  without intravenous fluids on hospital day #2, July 17, 2009, the  patient became acutely short of breath and went into pulmonary edema.  She did require intravenous diuresis which resolved her symptoms.  The  patient was evaluated by the [facility] group and they confirmed  progression of the patient's known aortic stenosis.  They did not feel  the patient needed further interventions and recommended that she be on  a diuretic on a daily basis and her ACE inhibitor and Norvasc be  discontinued due to her relative hypotension.  [name] hip pain was  controlled with low-dose Vicodin and we also have changed her  antidepressant tablet to Remeron as to be able to discontinue Celexa  which we felt could have contributed to the hyponatremia.      Lonia Blood, M.D.  Electronically Signed     SL/MEDQ  D:  07/20/2009  T:  07/20/2009  Job:  [id number]   cc:   Corwin Levins, MD  [name]. Excell Seltzer, MD

## 2011-04-27 NOTE — Discharge Summary (Signed)
[name], [name]                ACCOUNT NO.:  [id number]   MEDICAL RECORD NO.:  [id number]          PATIENT TYPE:  INP   LOCATION:  5505                         FACILITY:  [facility]   PHYSICIAN:  Valerie A. Felicity Coyer, MDDATE OF BIRTH:  01/07/1914   DATE OF ADMISSION:  07/16/2009  DATE OF DISCHARGE:  07/21/2009                               DISCHARGE SUMMARY   ADDENDUM    Please see discharge summary dictation by Dr. Lavera Guise dictated yesterday,  July 20, 2009.  The discharge diagnoses are as stated previously  without change.  Discharge medications are changed as follows:  As  according to Dr. Excell Seltzer, cardiologist, recommendations on the day of  discharge:  1. Avelox 400 mg daily for 2 additional days.  2. Lasix 20 mg daily.  3. Potassium chloride 10 mEq daily.  4. Levothyroxine 50 mcg daily.  5. Remeron 15 mg daily.  6. Xanax 0.5 mg b.i.d. p.r.n. anxiety.  7. Vicodin 1 p.o. q.4 h. p.r.n. severe pain.  8. Lisinopril 20 mg daily.  9. Metoprolol 25 mg b.i.d.   Prescriptions for her Vicodin and Xanax have been written at the time of  discharge and she is, otherwise, stable for discharge to skilled nursing  facility as discussed.  I felt to sign on day of discharge.   Greater than 30 minutes were spent on day of discharge in coordination  for discharge planning and review.  Note, followup is to be with Dr.  Excell Seltzer in the [facility] Group in 2-3 weeks.  Patient to call  for an appointment.      Valerie A. Felicity Coyer, MD  Electronically Signed     VAL/MEDQ  D:  07/21/2009  T:  07/22/2009  Job:  [id number]

## 2011-04-27 NOTE — Consult Note (Signed)
NAMEJANELIZ, [name]                ACCOUNT NO.:  [id number]   MEDICAL RECORD NO.:  [id number]          PATIENT TYPE:  INP   LOCATION:  2604                         FACILITY:  [facility]   PHYSICIAN:  Doylene Canning. Ladona Ridgel, MD    DATE OF BIRTH:  01/07/1914   DATE OF CONSULTATION:  07/17/2009  DATE OF DISCHARGE:                                 CONSULTATION   PRIMARY CARDIOLOGIST:  [name]. Excell Seltzer, MD   PRIMARY CARE PHYSICIAN:  Corwin Levins, MD   REASON FOR CONSULTATION:  Pulmonary edema in the setting of moderate to  ? severe aortic stenosis.   HISTORY OF PRESENT ILLNESS:  [name] is a 77 year old female with a  known history of moderate aortic stenosis, sinus bradycardia and a  questionable history of sinoatrial exit block presenting after a fall  complicated by right lower lobe pneumonia and pulmonary edema on chest x-  ray as well as mild hyponatremia, dynamic ST-T wave changes and minimal  troponin I elevation peaking at 0.30.  The patient reported having a  fall 2 days prior to her admission and presented to the ED for eval due  to her right hip pain.  On x-ray her right hip was not fractured,  however, chest x-ray showed moderate progressive cardiomegaly with  interval mild changes of CHF superimposed on COPD.  Right basilar air  space disease likely pneumonia, probable right pleural effusion.  Secondary to the patient's known history of aortic stenosis cardiology  was consulted due to the danger of significant hypotension in attempting  to pull fluid off this patient.  The patient does report having some  mild shortness of breath on admission that she attributes mostly to  being anxious secondary to her fall and pending hospital admission.  She  denies any chest pain at any time, orthopnea, change in minimal dyspnea  on exertion, palpitations, presyncope or syncope.  The patient also  denies any other significant medical changes like fever/chills,  nausea/vomiting, significant  coughing other than on the day just prior  to admission, abdominal upset until today.  On admission she was noted  to be slightly hypoxic on room air at O2 sat of 73% but greater than 90  with 4 liters by nasal cannula.  She had a mild temperature at 100.2  degrees Fahrenheit.  Otherwise, BP and heart rate close to within normal  limits and stable.  Currently she is on nonrebreather setting close to  100% and denying any shortness of breath.   PAST MEDICAL HISTORY:  1. Moderate aortic stenosis.  2. Hypertension.  3. Hyperlipidemia.  4. Hypothyroidism.  5. Glaucoma.  6. Anemia of chronic disease.  7. Osteoporosis.  8. Anxiety.   SOCIAL HISTORY:  The patient lives alone here in [place].  She denies  any significant smoking history.  She rarely drinks alcohol.  Denies any  illicit drugs or herbal medications.  She eats a heart healthy diet and  exercises nearly every day, short walks and generally very active.   FAMILY HISTORY:  Noncontributory secondary to the patient's advanced  age.   REVIEW  OF SYSTEMS:  Mild constipation over the last 48 hours.  Otherwise  see HPI.  All other systems reviewed and were negative.   CODE STATUS:  Full.   ALLERGIES:  NKDA.   HOME MEDICATIONS:  1. Levoxyl 50 mcg p.o. daily.  2. Alprazolam 0.5 mg p.o. b.i.d. p.r.n.  3. Lisinopril 20 mg p.o. b.i.d.  4. Norvasc 5 mg daily.  5. Metoprolol 25 mg p.o. b.i.d.  6. Ambien 5 mg p.o. q.h.s. p.r.n.  7. Celexa 10 mg p.o. daily.   INPATIENT MEDICATIONS:  1. Azithromycin 500 mg IV q.24 h.  2. Ceftriaxone 1 gram IV q.24 h.  3. Lovenox 40 mg subcu injection daily.  4. Vicodin 1-2 tablets p.o. q.4 h p.r.n.  5. Lasix 40 mg IV x1 this afternoon.   PHYSICAL EXAMINATION:  VITAL SIGNS:  Temperature down to 98.4 degrees  Fahrenheit, BP 100-140/40s-90s, pulse 70s, respiration rate 20, O2  saturation currently 99% on nonrebreather, weight 56.7 kilograms, up 0.8  kilograms since last admission March of  2008.  GENERAL:  The patient is alert and oriented x3 in no apparent distress.  She is able to move very slowly and speak in full sentences without  respiratory distress.  HEENT:  Head is normocephalic, atraumatic.  Pupils equal, round,  reactive to light.  Extraocular muscles are intact.  Nares are patent  without discharge.  Oropharynx without erythema or exudates.  NECK:  Supple without lymphadenopathy.  JVD on the left flat, on the  right 8 cm.  HEART:  Rate is regular with audible S1 and nearly totally muffled S2 at  the right upper sternal border secondary to a 5/6 systolic AS murmur.  Pulses are 2+ and equal in both upper and lower extremities bilaterally.  LUNGS:  Clear to auscultation bilaterally with decreased breath sounds  in both bases.  SKIN:  With no rashes, lesions or petechiae.  ABDOMEN:  Slightly firm, nontender, nondistended.  Normal abdominal  bowel sounds.  No rebound or guarding.  No hepatosplenomegaly and no  pulsations.  EXTREMITIES:  Showed no clubbing or cyanosis and minimal pitting edema  in the lower extremities bilaterally.  No joint deformity or effusions.  No spinal or CVA tenderness.  NEUROLOGICAL:  Reveals cranial nerves II-XII grossly intact.  Strength  5/5 in all extremities and in axial groups.  Normal sensation throughout  and normal cerebellar function.   RADIOLOGY:  Right hip without fracture, chest (see HPI for description).   EKG:  Shows sinus rhythm at a rate of 63, some dynamic ST depression in  V4, V5, V6 and lead 2, normal axis.  No evidence of hypertrophy  intervals within normal limits.   WBC 12.1 from 18.6 and differential showing neutrophils 89%, HGB 10.8  from 11.7, HCT 32.0 from 34.9, PLT count 127, sodium 129 (fairly stable  over the last 2 years), potassium 4.0, chloride 98, CO2 27, BUN 17,  creatinine 0.76, glucose 98.  Liver function tests within normal limits.  Albumin 2.7, calcium 8.4.  First set of point of care markers  negative.  Three sets of cardiac enzymes with minimally elevated troponin of 0.29,  0.30 and 0.18 without any elevated CK/MB index.   ASSESSMENT AND PLAN:  The patient was seen and examined by Dr. Lewayne Bunting per recommendation from primary team for question of severity of  aortic stenosis in the setting of acute on chronic diastolic heart  failure complicated by community acquired pneumonia.  The patient has  received 40 mg  of IV Lasix and has improved.  1. Severe (by exam) aortic stenosis.  2. Community acquired pneumonia.  3. Acute on chronic diastolic congestive heart failure, secondary to      aortic stenosis.  Recommend judicious IV Lasix (20 mg at least 6      hours from first dose, tonight, and 40 mg again tomorrow in the      a.m., both IV).  Check BNP, keep electrolytes therapeutic, monitor      BP closely along with renal function.  Would recommend stopping      amlodipine first and Lisinopril second should the patient have      hypotension with Lasix therapy.   Thank you for the consult.  Will continue to follow.      Jarrett Ables, PAC      Doylene Canning. Ladona Ridgel, MD  Electronically Signed    MS/MEDQ  D:  07/17/2009  T:  07/17/2009  Job:  [id number]

## 2011-04-30 NOTE — Assessment & Plan Note (Signed)
[facility]                            CARDIOLOGY OFFICE NOTE   [name], [name]                       MRN:          [id number]  DATE:03/28/2007                            DOB:          01/07/1914    [name] returns for outpatient followup at the [facility] on March 28, 2007.  She is a delightful, 77 year old woman who  was recently hospitalized at [facility] after a fall.  She sustained a  hip fracture in February and completed inpatient rehab.  Her first night  back home, she fell to the ground.  She denied any precipitating  symptoms and could not remember if she tripped or if she actually had a  brief loss of consciousness.  She was observed in the hospital over a  two to three day period and really had no other problems.  Telemetry did  not demonstrate any significant arrhythmias and she was discharged home  at that point and has done relatively well since her return home.  She  denies any further episodes of lightheadedness since her return home on  March 14.  She denies palpitations, chest pain, shortness of breath, or  any other complaints at this time.   CURRENT MEDICATIONS:  1. Fosamax 70 mg daily.  2. Lisinopril 20 mg daily.  3. Norvasc 5 mg daily.  4. Synthroid 50 mg daily.  5. Toprol XL 25 mg daily.   PHYSICAL EXAMINATION:  GENERAL:  The patient is an elderly woman who  weighs 113 pounds.  She is in no acute distress.  VITAL SIGNS:  Blood pressure is 154/76.  Heart rate 73.  Respiratory  rate 16.  HEENT:  Normal.  NECK:  Normal carotid upstrokes with transmitted aortic valvular sounds.  Jugular venous pressure is normal.  LUNGS:  Clear to auscultation bilaterally.  HEART:  Regular rate and rhythm with a 3/6 systolic crescendo murmur at  the right upper sternal border.  There are no gallops.  There are no  diastolic murmurs.  ABDOMEN:  Soft and nontender.  No organomegaly.  EXTREMITIES:  No cyanosis,  clubbing, or edema.  Peripheral pulses are 2+  and equal throughout.   EKGs performed in the office today show sinus bradycardia with left  atrial enlargement and left axis deviation.  There are no significant ST  or T wave changes, and there is a single PAC present.  A second EKG  performed showed sinus rhythm with grouped beating suggestive of SA exit  block.  This also demonstrated left atrial enlargement with left axis  deviation and a nonspecific T wave abnormality.   ASSESSMENT:  [name] is a 77 year old woman with the following  cardiac issues:  1. Moderate aortic stenosis.  2. 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  I asked [name] to      undergo a 24-hour Holter monitor to better evaluate her rhythm but      she declined.  She really does not want any testing done at  her      age.  She feels like she is doing well at home and does not want to      undergo any more tests.  I think this is fine and I agree that      overall she is really recovering well from her hip surgery.  If she      has recurrent falls or suspected syncope, we may need to further      investigate her for any significant arrhythmias.  Her left      ventricular function is normal and I do not think her aortic      stenosis will ever be problematic with her considering her advanced      age.  I will plan on seeing her back in six months for followup.     Karen Fells. Excell Seltzer, MD  Electronically Signed    MDC/MedQ  DD: 03/28/2007  DT: 03/29/2007  Job #: [id number]   cc:   Corwin Levins, MD

## 2011-04-30 NOTE — Discharge Summary (Signed)
[name], [name]                ACCOUNT NO.:  [id number]   MEDICAL RECORD NO.:  [id number]          PATIENT TYPE:  INP   LOCATION:  1427                         FACILITY:  [facility]   PHYSICIAN:  [name]. Excell Seltzer, MD  DATE OF BIRTH:  01/07/1914   DATE OF ADMISSION:  02/21/2007  DATE OF DISCHARGE:  02/24/2007                               DISCHARGE SUMMARY   PRIMARY CARE PHYSICIAN:  Dr. Jonny Ruiz.   PRIMARY CARDIOLOGIST:  Dr. [name]. Cooper.   PROCEDURES PERFORMED DURING HOSPITALIZATION:  None.   DISCHARGE DIAGNOSES:  1. Status post fall without evidence of syncope or arrhythmia.  2. Mildly elevated troponins secondary to #1.   SECONDARY DIAGNOSES:  1. Status post right total hip replacement January 23, 2007.  2. Moderate aortic stenosis with mean gradient of 26 mmHg by      echocardiogram January 21, 2007, with vigorous LV function on that      echocardiogram.  3. Hypertension.  4. Hyperlipidemia (on diet).  5. Treated hypothyroidism:  History of partial thyroidectomy secondary      to goiter.  6. Glaucoma.   HISTORY OF PRESENT ILLNESS:  This is a 77 year old  Caucasian female  with a history of moderate aortic stenosis by echocardiogram on January 21, 2007, as discussed above.  The patient had recently been discharged  from [facility] after having had a hip fracture and  repair on February 2008, on the right.  After the patient had been home,  she took a sudden fall, falling to the floor onto her buttocks.  She did  not have any near syncope or seizure activity prior to that and denied  any chest pain or shortness of breath or light headed prior to the  episode, but the patient is unsure if she actually passed out or lost  consciousness.  She does not remember any of that occurring.  Unfortunately, the patient laid on the floor for approximately seven  hours when her Home Health Nurse arrived and found her on the floor.  The patient was brought to [facility] Emergency Room for further  evaluation secondary to the fall and a hip x-ray was completed and was  found to be non-acute.  EKG was also completed, and it was found to be  normal.  Unfortunately, cardiac markers were found to be mildly elevated  on admission with a CK-MB at 4.1 and troponin at 0.14 with a creatinine  kinase at 332.  Secondary to this, the patient was admitted for further  evaluation for cardiac etiology of fall.   The patient was placed on aspirin.  Her beta-blocker was held for the  first 24 hours secondary to some mild bradycardia.  The patient's  troponins did trend downward from 0.14 to 0.25 throughout  hospitalization.  The patient was seen and examined by Dr. Tonny Bollman the following day and his note reflected that he suspected the  cardiac enzymes were related more to muscle injury versus cardiac.  The  patient's heparin was discontinued and Social Services were consulted  for evaluation of home needs  to include a bedside commode, Lifeline  monitor and physical rehabilitation at home.   The patient was seen by [facility] along with Occupational Therapy  and Physical Therapy concerning home, health and followup needs.  It was  suggested the patient have a bedside commode, also a 3 in 1 prior to  discharge home.  The patient was also advised on a Lifeline monitor  which has been set up prior to discharge along with Home Physical  Therapy and continued Home Health Nursing.   On day of discharge, the patient was seen and examined by Dr. __________  and found to be stable from a cardiac standpoint for discharge.  The  patient's metoprolol was changed from b.i.d. to metoprolol XL 25 mg once  a day without further cardiac workup suggested.  The patient had refused  any further cardiac testing as well and did not wish to proceed to a  cardiac catheterization either.   DISCHARGE LABS:  Sodium 131.  Potassium 4.0.  Chloride 100.  CO2 26.  Glucose 107.   BUN 10.  Creatinine 0.57.  Calcium 8.6.  Cholesterol 124.  Lipids 56.  HDL 54.  LDL 59.  Urinalysis was negative for UTI.   VITAL SIGNS ON DISCHARGE:  Blood pressure 119/46, pulse 57, respirations  18, temperature 98.0.  O2 saturation 97% on room air.  The patient  weighed 55.98 kg.   FOLLOWUP APPOINTMENTS AND PLAN:  1. The patient has a previously scheduled appointment with Dr. Oliver Barre, her Primary Care Physician, this month at unknown date.  She      has this listed at home, and I have advised her to keep it.  2. The patient has a followup appointment with Dr. Tonny Bollman on      March 08, 2007, at 2:15 p.m.  3. The patient is advised to stop taking her Coumadin as she was      prescribed at home.  After discussion with Dr. Excell Seltzer, it was found      that this was safe after 30 days to stop Coumadin status post hip      replacement.  4. The patient has been advised that a bedside commode and Lifeline      monitor have been arranged for her on discharge.  5. The patient is to be followed by Home Health Nursing and Physical      Therapy at home.  This is to be set up per [facility] prior to      discharge.   DISCHARGE MEDICATIONS:  1. Aspirin 81 mg daily.  2. Norvasc 5 mg daily.  3. Synthroid 50 mcg daily.  4. Metoprolol XL 25 mg once a day.  5. Lisinopril 20 mg daily.  6. Robaxin 500 mg every 6 hours p.r.n.  7. Flomax weekly.   ALLERGIES:  No known drug allergies.   Time spent with the patient to include Physician time 50 minutes.      Bettey Mare. Lyman Bishop, NP      [name]. Excell Seltzer, MD  Electronically Signed    KML/MEDQ  D:  02/24/2007  T:  02/25/2007  Job:  [id number]   cc:   Corwin Levins, MD  [address]  [place]  [place] [address]

## 2011-04-30 NOTE — Op Note (Signed)
[name], [name]                ACCOUNT NO.:  [id number]   MEDICAL RECORD NO.:  [id number]          PATIENT TYPE:  INP   LOCATION:  1435                         FACILITY:  [facility]   PHYSICIAN:  Loreta Ave, M.D. DATE OF BIRTH:  01/07/1914   DATE OF PROCEDURE:  01/22/2007  DATE OF DISCHARGE:                               OPERATIVE REPORT   PREOPERATIVE DIAGNOSIS:  Displaced femoral neck fracture, right hip.   POSTOPERATIVE DIAGNOSES:  Displaced femoral neck fracture, right hip,  with underlying osteoporosis and mild-to-moderate degenerative changes,  right hip.   PROCEDURE:  Hemiarthroplasty, right hip, Stryker prosthesis, cemented, a  #5 femoral component with a +5 neck and a 43-mm unipolar component, 127-  degree neck angle, 12-mm distal spacer.   SURGEON:  Loreta Ave, M.D.   ASSISTANT:  Julien Girt, P.A.   ANESTHESIA:  Spinal.   SPECIMENS:  None.   CULTURES:  None.   COMPLICATIONS:  None.   DRESSING:  Soft compressive with knee immobilizer.   BLOOD LOSS:  200 mL.   BLOOD GIVEN:  None.   DESCRIPTION OF PROCEDURE:  The patient was brought to the operating  room, and after adequate anesthesia had been obtained, turned to a  lateral position, right side up.  Prepped and draped in the usual  sterile fashion.  An incision along the shaft of the femur, extending  posterior superiorly.  The skin and subcutaneous tissues divided.  The  iliotibial band exposed and incised.  Charnley retractor put in place.  Neurovascular structures identified and protected.  External rotators  and capsule were taken down off the back of the intertrochanteric groove  on the femur.  The hip was exposed.  The hematoma was drained.  The  femoral head was removed.  It was used to size for a 43-mm component.  The femoral neck was cut in line with the prosthesis 1 fingerbreadth  above the lesser trochanter.  The acetabulum was exposed.  Mild-to-  moderate, but certainly not  extreme degenerative changes.  Copious  irrigation.  Loose bodies removed.  Reaming with appropriate broaches in  the femur.  Sized for a #5 cemented component.  After appropriate trial,  I chose a 127-degree neck angle, +5 neck, and a 43-mm head.  This gave  excellent restoration of leg lengths, good stability in flexion and  extension.  All trials were removed.  Copious irrigation.  Cement  restrictor placed 2 cm distal to the tip.  The centralizer was measured  for 12 mm.  After pulse irrigating, lavage, cement was prepared and  placed in the femoral canal.  The distal tip was attached to a #5  component, which was then seated, restoring normal femoral anteversion.  Once the cement hardened, the neck was attached, the head was attached  and the hip was reduced.  Excellent stability and restoration of leg  lengths.  The wound was irrigated.  External rotators and capsule were  repaired to the back of the intertrochanteric groove through drill holes  and tied over a bony bridge.  A Charnley retractor was removed.  The  iliotibial band was closed with #1 Vicryl, the skin and subcutaneous  tissue with 0 Vicryl and staples.  The margins of the wound were  injected with Marcaine.  A sterile compressive dressing was applied.  Returned to supine position.  Knee immobilizer applied.  Anesthesia  reversed.  Brought to the recovery room.  Tolerated surgery well with no  complications.      Loreta Ave, M.D.  Electronically Signed     DFM/MEDQ  D:  01/23/2007  T:  01/23/2007  Job:  [id number]

## 2011-04-30 NOTE — Discharge Summary (Signed)
NAMEJALAYNE, GANESH                ACCOUNT NO.:  [id number]   MEDICAL RECORD NO.:  [id number]          PATIENT TYPE:  INP   LOCATION:  1435                         FACILITY:  [facility]   PHYSICIAN:  Loreta Ave, M.D. DATE OF BIRTH:  01/07/1914   DATE OF ADMISSION:  01/21/2007  DATE OF DISCHARGE:  01/25/2007                               DISCHARGE SUMMARY   FINAL DIAGNOSES:  1. Status post a right hip hemiarthroplasty for femoral neck fracture.  2. Hypertension.  3. Osteoporosis.  4. Hypothyroidism.  5. History of heart murmur.  6. History of heart conduction system disease.   HISTORY OF PRESENT ILLNESS:  A 77 year old white female who lives alone,  who presented to the ED after having a fall at her home.  She landed on  her right hip and was unable to ambulate after this incident.  X-rays in  the ED showed a right subcapital femoral neck fracture.   ADMISSION LABORATORY DATA:  Sodium 133, potassium 3.6, chloride 97, CO2  29, glucose 140, BUN 9, creatinine 0.5, total protein 6.9, albumin 3.7,  AST 48, ALT 45, alkaline phosphatase 79, total bilirubin 0.5.  CK 1437,  CK-MB 21.8.  WBC 12.9, hemoglobin 12.4, hematocrit 36.5, platelets 282.   HOSPITAL COURSE:  On January 21, 2007, after the patient was evaluated  in the ER, she was transferred to telemetry.  Evaluated by cardiology  and given clearance.  She was placed in 5 pounds of Buck's traction and  scheduled for the OR the following day.  The surgical procedure along  with the potential risks and complications was discussed with the  patient and her family.  On January 22, 2007, the patient was sent to  the [facility] OR and a right hip hemiarthroplasty procedure performed.  Surgeon Loreta Ave, M.D., and assistant Julien Girt, P.A.-  C.  Anesthesia:  Spinal.  EBL:  250 mL.  There were no surgical or  anesthesia complications, and the patient was transferred to recovery in  stable condition.  Started on Lovenox  40 mg subcutaneous daily for DVT  prophylaxis.  On January 23, 2007, the patient doing well with good  pain control.  No complaints of chest pain or shortness of breath.  WBC  11.7, hemoglobin 8.7, hematocrit 25.3, platelets 183.  Sodium 129,  potassium 3.9, chloride 101, CO2 23, BUN 13, creatinine 0.5, glucose  112.  The dressing slight bleeding through bandage but intact.  Calf  nontender, neurovascularly intact.  Leg lengths equal.  Started on PT.  The patient advised that she would likely need a skilled nursing  facility placement for rehab since she lives alone.  Monitor hemoglobin  due to a postop blood loss anemia.  January 24, 2007, good pain  control.  Complained of feeling fatigued and weak when she was up the  day before.  No complaints of chest pain or shortness of breath.  Temperature 98, pulse 82, respirations were 16, blood pressure 134/50.  WBC 10.5, hemoglobin 8.2, hematocrit 23.9, platelets 188.  Sodium 133,  potassium 4.3, chloride 107, CO2 24, BUN 14, creatinine  0.5, glucose 95.  INR 1.2.  Urine cultures are negative.  The wound looked good with  staples intact.  No drainage or signs of infection.  Calf nontender,  neurovascularly intact.  The patient transfused 2 units of packed red  blood cells and given Lasix 20 mg IV after each unit.  Dressing change.  Discontinued Foley and saline-locked IV after transfusion.  Care  management discussed discharge planning with the patient and her family  and they agreed to a skilled facility placement when stable.  On  January 25, 2007, the patient doing very well with good pain control.  States that she feels much better after transfusion.  No specific  complaints.  Temperature 97.5, pulse 68, respirations 18, and blood  pressure 132/50.  WBC 10.6, hematocrit 32.3, hemoglobin 11.0, platelets  236.  Sodium 127, potassium 3.9, chloride 97, CO2 25, BUN 13, creatinine  0.5, glucose 106.  The wound looked good, staples intact.   There was  moderate serous drainage on the dressing but nothing active.  No signs  of infection.  Negative log roll.  Calf nontender, neurovascularly  intact.  Leg lengths equal.  The patient was alert and oriented and  sitting comfortably in recliner.  A bed became available at [facility]  skilled facility and she is stable for transfer.   CONDITION:  Good and stable.   DISPOSITION:  Transfer to [facility] skilled facility.   MEDICATIONS:  1. Hydrochlorothiazide 25 mg p.o. daily.  2. Norvasc 5 mg p.o. daily.  3. Levothyroxine 25 mcg p.o. daily.  4. Lisinopril 20 mg p.o. q.h.s.  5. Lovenox 40 mg subcutaneous injection daily x4 weeks postop.  6. Colace 100 mg p.o. b.i.d.  7. Tylenol 325-650 mg p.o. q.6h. p.r.n. for temperature greater than      101.  8. Iron sulfate 325 mg p.o. b.i.d. with meals x2 weeks.  9. Xanax 0.25 mg p.o. q.8h. p.r.n. for anxiety.  10.Norco 5/325 mg one or two tablets p.o. q.4-6h. p.r.n. for pain.  11.Robaxin 500 mg one tablet p.o. q.6h. p.r.n. for spasms.   INSTRUCTIONS:  The patient will continue to work with physical therapy  to improve ambulation and strengthening.  She is weightbearing as  tolerated.  Daily dry dressing changes with 4 x 4 gauze and tape.  She  will remain on Lovenox 40 mg subcutaneous injections daily x4 weeks  postop.  She will follow up in the office with Dr. Eulah Pont at 2 weeks  postop for recheck and staple removal.  If there are any questions or  concerns before that time, we can be reached at [phone] at [facility].  The patient's primary care physician should be  notified of her stay at [facility] in case she needs to be followed  for her other medical issues.      Genene Churn. Denton Meek.      Loreta Ave, M.D.  Electronically Signed    JMO/MEDQ  D:  01/25/2007  T:  01/25/2007  Job:  [id number]

## 2011-04-30 NOTE — Consult Note (Signed)
NAMENAYLEAH, GAMEL                ACCOUNT NO.:  [id number]   MEDICAL RECORD NO.:  [id number]          PATIENT TYPE:  INP   LOCATION:  1435                         [facility]ACILITY:  [facility]   PHYSICIAN:  Gerrit Friends. Dietrich Pates, MD, FACCDATE OF BIRTH:  01/07/1914   DATE OF CONSULTATION:  DATE OF DISCHARGE:                                 CONSULTATION   ADDENDUM   LABORATORY:  Echocardiogram shows mild left ventricular hypertrophy with  normal systolic function; there is moderate calcific aortic stenosis.   IMPRESSION:  Ms. Leder presents with moderately severe asymptomatic  aortic stenosis.  While this does convey some increased surgical risk,  it is not prohibitive.  There does not appear to be any additional  cardiac assessment or treatment that would decrease her surgical risk.  Accordingly, we agree with plans for surgery in the morning.  The  shorter her period of bedrest, the less likely she will have significant  postoperative complications.   She does have a component of conduction system disease as manifested by  first-degree AV block.  She also has hypertensive heart disease.  We  will be alert for any complications related to these anomalies.   Otherwise, she appears to be a very healthy 77 year old.  Of course her  advanced age increases her risk for other comorbidities such as vascular  disease.  Her active lifestyle and excellent medical care to date are  points in her favor.   We appreciate the request for consultation and will be happy to follow  this very impressive woman with you.      Gerrit Friends. Dietrich Pates, MD, [facility]  Electronically Signed     RMR/MEDQ  D:  01/22/2007  T:  01/23/2007  Job:  [id number]

## 2011-04-30 NOTE — H&P (Signed)
[name], [name]                ACCOUNT NO.:  [id number]   MEDICAL RECORD NO.:  [id number]          PATIENT TYPE:  INP   LOCATION:  1427                         FACILITY:  [facility]   PHYSICIAN:  Karen Sans. Wall, MD, FACCDATE OF BIRTH:  01/07/1914   DATE OF ADMISSION:  02/21/2007  DATE OF DISCHARGE:                              HISTORY & PHYSICAL   CARDIOLOGIST:  The patient has been evaluated in the past by Dr. Callaway Bing for surgical clearance in February 2008.   PRIMARY CARE PHYSICIAN:  Dr. Jonny Ruiz.   CHIEF COMPLAINT:  Status post fall.  Cardiac markers noted to be  elevated in the emergency room.   HISTORY OF PRESENT ILLNESS:  [name] is a 77 year old female patient  with a history of moderate aortic stenosis by echocardiogram January 21, 2007 with a mean gradient of 26 mmHg who is status post right total hip  replacement secondary to hip fracture February 2008 who was just  discharged from rehab back to her home yesterday.  Today around 8  o'clock this morning she got up to the kitchen.  Shortly after getting  up to the sink, she fell to the floor onto her buttocks. She denies any  antecedent symptoms.  She denies any chest pain, shortness of breath,  light-headedness or near syncope.  She denies any true syncope.  However, when asked further she says that she is not sure if she passed  out.  Although she does state that she remembers everything that  happened.  She denies any loss of consciousness.  She could not get up  and lay on the floor from around 8:00 a.m. to around 3:00 p.m. when her  home health nurse arrived.  In the emergency room, her hip x-ray is  nonacute.  EKG is nonacute.  Cardiac markers were noted to be elevated  with a troponin of 0.19 and a CK-MB of 21.3.  These were point care  markers.   PAST MEDICAL HISTORY:  1. Moderate aortic stenosis with a mean gradient of 26 mmHg by      echocardiogram January 21, 2007.  She had vigorous LV function on    that echocardiogram.  2. Hypertension, treated.  3. Hyperlipidemia, diet therapy.  4. Treated hypothyroidism.      a.     History of partial thyroidectomy secondary to goiter.  5. Glaucoma.  6. HISTORY of right total hip replacement January 23, 2007.   MEDICATIONS:  1. Coumadin 4 mg daily.  2. Lisinopril 20 mg daily.  3. Norvasc 5 mg daily.  4. Robaxin 500 mg q. 6 hours.  5. Synthroid 0.05 mg daily.   ALLERGIES:  No known drug allergies.   SOCIAL HISTORY:  The patient lives by herself.  She is widowed.  She  used to be a housewife.  She denies tobacco or alcohol abuse.   FAMILY HISTORY:  Insignificant for coronary artery disease. Her father  had hypertension.   REVIEW OF SYSTEMS:  Please see HPI.  Denies any fevers or chills.  Denies any orthopnea or paroxysmal nocturnal dyspnea. Denies any  dysuria  or hematuria.  Denies any numbness.  Denies any nausea, vomiting, or  diarrhea.  Denies any bright red blood per rectum or melena.  She does  note edema.  She says her right lower extremity edema is worse. It has  been worse since her surgery.  The rest of the review of systems are  negative.   PHYSICAL EXAM:  GENERAL:  She is a well-nourished, well-developed,  elderly female in no acute distress.  VITAL SIGNS:  Blood pressure 136/68, pulse 58, respirations 18, oxygen  saturation 96% on room air. Temperature 97.3.  HEENT:  Head normocephalic, atraumatic.  Sclerae are clear.  Anisocoria  noted bilaterally.  NECK:  Without obvious JVD.  Carotids with bruits versus radiating  murmurs bilaterally.  LYMPH:  Without lymphadenopathy.  CARDIAC:  Normal S1, S2. Regular rate and rhythm with a 2/6 systolic  ejection murmur heard best at the left sternal border.  LUNGS:  Clear to auscultation bilaterally without wheezing, rhonchi or  rales.  ABDOMEN:  Soft, nontender with normal active bowel sounds, no  organomegaly.  EXTREMITIES:  Revealed 2+ edema on the right, trace to 1+ edema on  the  left.  MUSCULOSKELETAL:  Right hip incision is healing well.  NEUROLOGIC:  She is alert and oriented x3. Cranial nerves II-XII grossly  intact.   Chest x-ray shows cardiomegaly without acute disease. Hip x-ray shows no  acute findings with normal appearing right hemiarthroplasty.  Electrocardiogram reveals sinus rhythm, heart rate of 88, normal axis,  no acute changes.   LABORATORY DATA:  Hemoglobin 14.2, hematocrit 41.9, platelet count  296,000, white count 14,600, sodium 134, potassium 3.3, BUN 9,  creatinine 0.52, glucose 123.  Point of care markers, CK-MB 21.3,  troponin I 0.19, myoglobin greater than 500. Serum markers total CK 770,  CK-MB 15.8, relative index 2.1.  Serum troponin I pending at this time.   ASSESSMENT:  1. Status post fall in an elderly woman status post recent right total      hip replacement without any obvious evidence of syncope.  2. Elevated cardiac markers.  3. Moderate aortic stenosis with a mean gradient of 26 mmHg by recent      echocardiogram.  4. Right lower extremity edema.  5. Status post right total hip replacement January 22, 2007.  6. Hypertension.  7. Diet-controlled hyperlipidemia.  8. Treated hypothyroidism.  9. Glaucoma.  10.Advanced age.  11.Hypokalemia.      a.     Repleted by the ER physician.   PLAN:  The patient presents to the emergency room today with a fall.  Her cardiac markers are elevated.  We are still awaiting serum troponin  I.  The initial CPK and CK-MB looked to be more consistent with pattern  of skeletal muscle injury.  We will admit her and treat her with aspirin  and heparin.  We will continue to follow her cardiac markers to see if  she definitely rules in for non-ST elevation myocardial infarction.  We  will refrain from beta-blocker at this time given her heart rate is in  the 50 and 60s.  Her INR is subtherapeutic and she does have right lower extremity edema.  We will get lower extremity Dopplers to rule  out DVT.  If she definitely rules in for non-ST elevation myocardial infarction,  we could consider cardiac catheterization.  However, she is at an  advanced age.  She does not really wish to pursue any aggressive  treatment at this point  in time.  If her enzyme picture is more  consistent with skeletal muscle injury, we can certainly consider  inpatient Myoview testing to risk stratify her.  The patient was also  interviewed and examined by Dr. Tawanna Cooler, cardiology fellow, with [facility]  cardiology.      Tereso Newcomer, PA-C      Karen Sans. Daleen Squibb, MD, [facility]  Electronically Signed    SW/MEDQ  D:  02/21/2007  T:  02/23/2007  Job:  [id number]   cc:   Corwin Levins, MD  [address]  [place]  [place] [address]

## 2011-04-30 NOTE — Consult Note (Signed)
[name], [name]                ACCOUNT NO.:  [id number]   MEDICAL RECORD NO.:  [id number]          PATIENT TYPE:  INP   LOCATION:  1435                         [facility]ACILITY[facility]   PHYSICIAN:  Gerrit Friends. Dietrich Pates, MD, FACCDATE OF BIRTH:  01/07/1914   DATE OF CONSULTATION:  DATE OF DISCHARGE:                                 CONSULTATION   REFERRING PHYSICIAN:  Dr. Eulah Pont.   PRIMARY CARE PHYSICIAN:  Dr. Jonny Ruiz.   HISTORY OF PRESENT ILLNESS:  [age]-year-old woman admitted for hip  fracture following a fall and found to have a systolic murmur.   Ms. [name] has not been known to have any significant cardiac problems.  She is amazingly active and mentally sharp for her age.  She was bending  over at home when she lost her balance and then found herself unable to  arise.  EMS was summoned, but the patient initially refused transport to  the hospital.  They assisted to put her in bed.  EMS subsequently was  recalled to the house, and brought the patient to the emergency  department where she was found to have a hip fracture that will require  total hip arthroplasty.   The patient has been told of a murmur in the past.  She does not recall  prior echocardiograms.  She has not had chest discomfort nor any  suspicion of coronary disease.  She does not recall any other cardiac  testing.  She is active both in her daily activities and also rides an  exercise bicycle.  She never experiences chest discomfort, dyspnea, nor  has she had any syncope.  She does have occasional brief palpitations.   There is a history of hypertension that has been well-treated medically.  She has hyperlipidemia but has not required pharmacologic therapy for  that problem.  She has not had diabetes, nor has she used tobacco  products.  There is a history of hypothyroidism following a partial  thyroidectomy for a diagnosis of goiter.   The patient has no known allergies.   MEDICATIONS PRIOR TO ADMISSION:  1.  Levothyroxine 0.025 mg daily.  2. Hydrochlorothiazide 25 mg daily.  3. Amlodipine 5 mg daily.  4. Xanax 0.25 mg daily.  5. Lisinopril 20 mg daily.   SOCIAL HISTORY:  The patient lives in [place] alone with family  nearby.  She is retired from Warehouse manager work.  There is no history of  excessive alcohol use.   FAMILY HISTORY:  Negative for coronary disease.   REVIEW OF SYSTEMS:  Notable for some chronic nasal stuffiness.  She has  been told of osteoporosis in the past and has been treated with Fosamax.  She has some visual impairment due to glaucoma.  She has partial  dentures.  She intermittently uses hypnotics for insomnia.  All other  systems reviewed and are negative.   EXAM:  A very pleasant older woman in no acute distress.  The temperature is 98.2, heart rate 95 and regular, respirations 18,  blood pressure 155/65, O2 saturation 95% on room air.  HEENT:  Anicteric sclerae; normal lids and conjunctiva;  normal oral  mucosa.  NECK:  No jugular venous distension; slightly diminished carotid  upstrokes with minimal transmitted murmur.  ENDOCRINE:  Transverse surgical scar; no thyromegaly.  HEMATOCRIT:  No adenopathy.  SKIN:  No significant lesions.  CARDIAC:  Normal first heart sounds; absent aortic component of the  second heart sound; harsh mid to late peaking grade 3/6 systolic  ejection murmur best heard at the cardiac base; normal PMI.  ABDOMEN:  Soft and nontender; no organomegaly; no masses.  EXTREMITIES:  Right lower extremity is in traction.  NEUROMUSCULAR:  Symmetric strength and tone; normal cranial nerves.   EKG:  Normal sinus rhythm with first-degree AV block; left atrial  abnormality; voltage criteria for LVH; leftward axis; minor Q-T  prolongation; nonspecific ST-segment abnormality.   Chest x-ray:  Hyperinflation with bronchitic changes.   Laboratory otherwise notable for normal CBC, normal chemistry profile  and normal coagulation studies.    IMPRESSION:   DICTATION ENDED AT THIS POINT      Gerrit Friends. Dietrich Pates, MD, [facility]  Electronically Signed     RMR/MEDQ  D:  01/22/2007  T:  01/23/2007  Job:  [id number]

## 2011-05-22 ENCOUNTER — Emergency Department (HOSPITAL_COMMUNITY): Payer: Medicare Other

## 2011-05-22 ENCOUNTER — Inpatient Hospital Stay (HOSPITAL_COMMUNITY)
Admission: EM | Admit: 2011-05-22 | Discharge: 2011-05-26 | DRG: 243 | Disposition: A | Discharge status: Nursing Home (Includes ICF) | Payer: Medicare Other | Source: Ambulatory Visit | Attending: Internal Medicine | Admitting: Internal Medicine

## 2011-05-22 ENCOUNTER — Emergency Department (HOSPITAL_COMMUNITY)
Admission: EM | Admit: 2011-05-22 | Discharge: 2011-05-22 | Disposition: A | Discharge status: Other Acute Inpatient Hospital | Payer: Medicare Other | Source: Home / Self Care | Attending: Emergency Medicine | Admitting: Emergency Medicine

## 2011-05-22 DIAGNOSIS — I5030 Unspecified diastolic (congestive) heart failure: Secondary | ICD-10-CM | POA: Insufficient documentation

## 2011-05-22 DIAGNOSIS — S0100XA Unspecified open wound of scalp, initial encounter: Secondary | ICD-10-CM | POA: Insufficient documentation

## 2011-05-22 DIAGNOSIS — I10 Essential (primary) hypertension: Secondary | ICD-10-CM | POA: Diagnosis present

## 2011-05-22 DIAGNOSIS — I498 Other specified cardiac arrhythmias: Principal | ICD-10-CM | POA: Diagnosis present

## 2011-05-22 DIAGNOSIS — F329 Major depressive disorder, single episode, unspecified: Secondary | ICD-10-CM | POA: Insufficient documentation

## 2011-05-22 DIAGNOSIS — F411 Generalized anxiety disorder: Secondary | ICD-10-CM | POA: Insufficient documentation

## 2011-05-22 DIAGNOSIS — E785 Hyperlipidemia, unspecified: Secondary | ICD-10-CM | POA: Diagnosis present

## 2011-05-22 DIAGNOSIS — I455 Other specified heart block: Secondary | ICD-10-CM | POA: Diagnosis present

## 2011-05-22 DIAGNOSIS — I359 Nonrheumatic aortic valve disorder, unspecified: Secondary | ICD-10-CM | POA: Insufficient documentation

## 2011-05-22 DIAGNOSIS — H409 Unspecified glaucoma: Secondary | ICD-10-CM | POA: Diagnosis present

## 2011-05-22 DIAGNOSIS — I509 Heart failure, unspecified: Secondary | ICD-10-CM | POA: Diagnosis present

## 2011-05-22 DIAGNOSIS — E039 Hypothyroidism, unspecified: Secondary | ICD-10-CM | POA: Insufficient documentation

## 2011-05-22 DIAGNOSIS — D649 Anemia, unspecified: Secondary | ICD-10-CM | POA: Diagnosis present

## 2011-05-22 DIAGNOSIS — I5032 Chronic diastolic (congestive) heart failure: Secondary | ICD-10-CM | POA: Diagnosis present

## 2011-05-22 DIAGNOSIS — F341 Dysthymic disorder: Secondary | ICD-10-CM | POA: Diagnosis present

## 2011-05-22 DIAGNOSIS — Z79899 Other long term (current) drug therapy: Secondary | ICD-10-CM | POA: Insufficient documentation

## 2011-05-22 DIAGNOSIS — F3289 Other specified depressive episodes: Secondary | ICD-10-CM | POA: Insufficient documentation

## 2011-05-22 DIAGNOSIS — R55 Syncope and collapse: Secondary | ICD-10-CM | POA: Diagnosis present

## 2011-05-22 DIAGNOSIS — Z96649 Presence of unspecified artificial hip joint: Secondary | ICD-10-CM | POA: Insufficient documentation

## 2011-05-22 DIAGNOSIS — M810 Age-related osteoporosis without current pathological fracture: Secondary | ICD-10-CM | POA: Diagnosis present

## 2011-05-22 DIAGNOSIS — W19XXXA Unspecified fall, initial encounter: Secondary | ICD-10-CM | POA: Insufficient documentation

## 2011-05-22 LAB — CBC
HCT: 39.4 % (ref 36.0–46.0)
MCHC: 33 g/dL (ref 30.0–36.0)
RDW: 13.8 % (ref 11.5–15.5)

## 2011-05-22 LAB — DIFFERENTIAL
Basophils Absolute: 0 10*3/uL (ref 0.0–0.1)
Basophils Relative: 0 % (ref 0–1)
Eosinophils Absolute: 0.3 10*3/uL (ref 0.0–0.7)
Eosinophils Relative: 2 % (ref 0–5)
Lymphocytes Relative: 9 % — ABNORMAL LOW (ref 12–46)
Monocytes Absolute: 1.3 10*3/uL — ABNORMAL HIGH (ref 0.1–1.0)

## 2011-05-22 LAB — POCT I-STAT, CHEM 8
Calcium, Ion: 1.27 mmol/L (ref 1.12–1.32)
Creatinine, Ser: 0.9 mg/dL (ref 0.4–1.2)
Glucose, Bld: 116 mg/dL — ABNORMAL HIGH (ref 70–99)
Hemoglobin: 15 g/dL (ref 12.0–15.0)
TCO2: 24 mmol/L (ref 0–100)

## 2011-05-22 LAB — BASIC METABOLIC PANEL
BUN: 15 mg/dL (ref 6–23)
Chloride: 99 mEq/L (ref 96–112)
Creatinine, Ser: 0.85 mg/dL (ref 0.4–1.2)
GFR calc Af Amer: >60 mL/min (ref >60)
GFR calc non Af Amer: >60 mL/min (ref >60)

## 2011-05-22 LAB — CK TOTAL AND CKMB (NOT AT ARMC)
CK, MB: 2 ng/mL (ref 0.3–4.0)
Relative Index: INVALID (ref 0.0–2.5)

## 2011-05-22 LAB — APTT: aPTT: 28 seconds (ref 24–37)

## 2011-05-22 LAB — PROTIME-INR: INR: 1 (ref 0.00–1.49)

## 2011-05-23 DIAGNOSIS — I498 Other specified cardiac arrhythmias: Secondary | ICD-10-CM

## 2011-05-23 LAB — BASIC METABOLIC PANEL
CO2: 29 mEq/L (ref 19–32)
Calcium: 9.3 mg/dL (ref 8.4–10.5)
Chloride: 103 mEq/L (ref 96–112)
Glucose, Bld: 101 mg/dL — ABNORMAL HIGH (ref 70–99)
Sodium: 140 mEq/L (ref 135–145)

## 2011-05-25 ENCOUNTER — Inpatient Hospital Stay (HOSPITAL_COMMUNITY): Payer: Medicare Other

## 2011-05-25 ENCOUNTER — Encounter: Payer: Self-pay | Admitting: *Deleted

## 2011-06-03 ENCOUNTER — Ambulatory Visit (INDEPENDENT_AMBULATORY_CARE_PROVIDER_SITE_OTHER): Payer: Medicare Other | Admitting: *Deleted

## 2011-06-03 DIAGNOSIS — I495 Sick sinus syndrome: Secondary | ICD-10-CM

## 2011-06-03 NOTE — Progress Notes (Signed)
ICD checked in office./ ROV in 3 months with Dr. Graciela Husbands.

## 2011-06-08 NOTE — Op Note (Signed)
  [name], [name] NO.:  [id number]  MEDICAL RECORD NO.:  [id number]  LOCATION:  2901                         FACILITY:  [facility]  PHYSICIAN:  Armanda Magic, M.D.     DATE OF BIRTH:  01/07/1914  DATE OF PROCEDURE:  05/23/2011 DATE OF DISCHARGE:                              OPERATIVE REPORT   REFERRING PHYSICIAN:  [facility].  PROCEDURE:  Placement of temporary transvenous pacing wire via right femoral vein.  OPERATOR:  Armanda Magic, MD  INDICATIONS:  Syncope with intermittent 8-12 seconds pauses, symptomatic with nausea, diaphoresis, and dizziness.  COMPLICATIONS:  None.  IV ACCESS:  Via right femoral vein 6-French sheath.  This is a 77 year old female who has a history of hypertension and actually a history of sinus bradycardia in the past as well as moderate severe aortic stenosis who presented with a syncopal episode.  She has had a history of dizzy spells off and on for several months now.  In the emergency room, she was noted to have episodes of sinus rhythm in the 60- 80 beats per minute range and then subsequently would drop her heart rate to 15 beats per minute with pulses of asystole, then heart rate was still into the 40 beat per minute range, and back up into 60-80 beat per minute range.  She was transferred to [facility] CCU where she had a 12-second episode of asystole with severe nausea, diaphoresis, and dizziness.  She now presents for placement of a temporary pacing wire.  The patient was brought to the cardiac catheterization lab and connected to continuous heart rate and pulse oximetry monitoring and intermittent blood pressure monitoring.  The right groin was prepped and draped in a sterile fashion.  Xylocaine 1% was used for local anesthesia.  Using a modified Seldinger technique, a 6-French sheath was placed in right femoral vein.  Under fluoroscopic guidance, a temporary pacemaker wire was placed under  balloon flotation at the right ventricle.  After pacemaker wire was positioned along the inferior wall of the right ventricle under fluoroscopic guidance.  Adequate pacing thresholds were obtained with a pacing threshold of less than or equal to 0.6 mA.  The pacemaker was set up at 50 beats per minute and output of 4 mA.  At the end of procedure, the procedure was transferred back to room in stable condition.  ASSESSMENT: 1. Symptomatic 8-12 second pauses of asystole with presentation with     syncope. 2. Successful placement of a temporary transvenous pacer wire via     right femoral vein.  PLAN:  Pacer set at 50 beats per minute with 4 mA.  Further workup and determination of need for permanent pacemaker in several days once the patient's Lopressor has washed out of her system, this will be determined by [facility].     Armanda Magic, M.D.     TT/MEDQ  D:  05/23/2011  T:  05/23/2011  Job:  [id number]  cc:   [name]. Excell Seltzer, MD  Electronically Signed by Armanda Magic M.D. on 06/08/2011 09:58:43 PM

## 2011-06-08 NOTE — H&P (Signed)
[name], [name]                ACCOUNT NO.:  [id number]  MEDICAL RECORD NO.:  [id number]  LOCATION:  WLED                         FACILITY:  [facility]  PHYSICIAN:  Armanda Magic, M.D.     DATE OF BIRTH:  01/07/1914  DATE OF ADMISSION:  05/22/2011 DATE OF DISCHARGE:                             HISTORY & PHYSICAL   PRIMARY CARDIOLOGIST:  [name]. Excell Seltzer, MD  CHIEF COMPLAINT:  Syncope and bradycardia.  HISTORY OF PRESENT ILLNESS:  This is a 77 year old white female with a history of moderate-to-severe aortic stenosis by echo in 2010, chronic diastolic heart failure, sinus bradycardia, who has been having episodic dizzy spells for several months, and today while walking in her bathroom had a syncopal spell, falling backwards and hitting her head.  She was found sitting up by the nursing aide with blood running down her neck. EMS was called and the patient was transferred to [facility] ER with a heart rate of 72 beats per minute and blood pressure 126/78 mmHg.  In the ER, she was noted to have some transient episodes of sinus bradycardia into the 20s.  She denies any chest pain, nausea, vomiting or lower extremity edema or other episodes of syncope.  She has had several falls over the past couple of years.  She has had some occasional shortness of breath and dizziness as well.  PAST MEDICAL HISTORY:  Moderate-to-severe aortic stenosis with normal LV function by echo in August 2010, low back pain secondary to fall with L1 compression fracture, hypertension, hypothyroidism, chronic diastolic heart failure, sinus bradycardia, glaucoma, osteoporosis, anemia of chronic disease, depression, anxiety.  PAST SURGICAL HISTORY:  Right hip arthroplasty, partial thyroidectomy.  FAMILY HISTORY:  Her mother died of complications of hypertension.  Her father died of pneumonia.  Her brother died of complications of hypertension.  SOCIAL HISTORY:  She is widowed and lives in an assisted living  at [place].  She denies any tobacco use.  She occasionally drinks alcohol.  REVIEW OF SYSTEMS:  Otherwise as stated in HPI is negative.  MEDICATIONS: 1. Norvasc 5 mg daily. 2. Miacalcin nasal spray, 1 spray one nostril daily alternating     nostrils. 3. Multivitamin daily. 4. Calcarb 600/400. 5. Vitamin D 1 tablet b.i.d. 6. Lisinopril 20 mg daily. 7. Lopressor 50 mg b.i.d. 8. Celexa 10 mg daily. 9. Fish oil 1000 mg daily. 10.Synthroid 50 mcg daily. 11.Remeron 30 mg nightly. 12.Senna 1 tablet nightly. 13.Ambien 5 mg nightly p.r.n. for sleep.  PHYSICAL EXAMINATION:  GENERAL:  This is a well-developed, well- nourished female, in no acute distress. HEENT:  Benign. NECK:  Supple without lymphadenopathy.  Carotid upstrokes are +2 bilaterally with bilateral bruits radiating up from the heart. LUNGS:  Clear to auscultation throughout. HEART:  Regular is rate and rhythm with 2/6 systolic late-peaking murmur at the right upper sternal border, radiating to left lower sternal border. ABDOMEN:  Soft, nontender, nondistended with active bowel sounds.  No hepatosplenomegaly. EXTREMITIES:  No cyanosis, erythema, or edema.  LABORATORY DATA:  Sodium 138, potassium 3.7, chloride 100, bicarb 24, BUN 16, creatinine 0.9, glucose 116.  White cell count 12.1, hemoglobin 13, hematocrit 39.4, platelet count 215, INR 1.  Chest x-ray is pending. EKG shows sinus bradycardia at 34 beats per minute with LVH by voltage with repolarization.  ASSESSMENT: 1. Syncope secondary to transient bradycardia. 2. Bradycardia.  Heart rate intermittently decreases into the 30s. 3. Moderate-to-severe aortic stenosis, severe by exam. 4. Hypertension with elevated blood pressure. 5. Hypothyroidism. 6. History of chronic diastolic heart failure.  PLAN:  Transfer to [facility].  Stop Lopressor.  We will check a TSH.  Glucagon was given in the ER at [facility].  We will continue ZOLL  pads on her chest.  We will not place temporary pacemaker at this time given that her heart rate at present is in the 50s to 60s, and then she was transiently dropped into the 30s. We will allow her Lopressor to wash out and see if bradycardia resolves. If it does not, she will need a permanent pacemaker.  We will increase lisinopril to 20 mg b.i.d. as needed for blood pressure control. Further workup per [facility].     Armanda Magic, M.D.     TT/MEDQ  D:  05/22/2011  T:  05/23/2011  Job:  [id number]  cc:   [name]. Excell Seltzer, MD  Electronically Signed by Armanda Magic M.D. on 06/08/2011 09:58:40 PM

## 2011-06-15 NOTE — Op Note (Signed)
  NAMEMARQUESA, RATH NO.:  [id number]  MEDICAL RECORD NO.:  [id number]  LOCATION:  2901                         FACILITY:  [facility]  PHYSICIAN:  Duke Salvia, MD, FACCDATE OF BIRTH:  01/07/1914  DATE OF PROCEDURE:  05/24/2011 DATE OF DISCHARGE:                              OPERATIVE REPORT   SURGEON:  Duke Salvia, MD, [facility]  PREOPERATIVE DIAGNOSIS:  Intermittent sinus node arrest with syncope.  POSTOPERATIVE DIAGNOSIS:  Intermittent sinus node arrest with syncope.  PROCEDURES:  Implantation of dual-chamber pacemaker.  Following obtaining informed consent, the patient was brought to the Electrophysiology Laboratory and placed on the fluoroscopic table in supine position.  After routine prep and drape of the left upper chest, lidocaine was infiltrated in prepectoral subclavicular region.  An incision was made and carried down to the layer of the prepectoral fascia using electrocautery and sharp dissection.  A pocket was performed similarly.  Hemostasis was obtained.  Thereafter, attention was turned to gain access to extrathoracic left subclavian vein, which was accomplished without difficulty without the aspiration of air or puncture of the arteries.  Two venipunctures were accomplished.  Guidewires were placed and retained and sequentially, 7- French sheaths were placed through, which were passed a [employer] passive fixation ventricular lead, serial number [id number] and a 1944 46-cm passive fixation atrial lead, serial number [id number].  Under fluoroscopic guidance, these were manipulated to the right ventricular apex and the right atrial appendage respectively where the bipolar R- wave was 10 with a pace impedance of 762, threshold 0.65 at 0.5.  The bipolar P-wave was 3.1 with a pace impedance of 531, the threshold of 0.5 volt at 0.5.  These leads were secured to the prepectoral fascia and then attached to a Medtronic Adapta L pulse generator, serial  number [id number].  Ventricular pacing and atrial pacing were identified.  The pocket was copiously irrigated with antibiotic containing saline solution.  Hemostasis was assured.  The leads and the pulse generator were placed in the pocket secured to the prepectoral fascia.  The leads having been secured.  The wounds were then closed in three layers in normal fashion.  The wound was washed, dried, and Benzoin, Steri-Strip dressing was applied.  Needle counts, sponge counts, and instrument counts were correct at the end of the procedure according to the staff. The patient tolerated the procedure well without apparent complication.     Duke Salvia, MD, [facility]    SCK/MEDQ  D:  05/24/2011  T:  05/25/2011  Job:  [id number]  Electronically Signed by Sherryl Manges MD [facility] on 06/15/2011 04:32:02 PM

## 2011-06-15 NOTE — Discharge Summary (Signed)
NAMEMATISYN, CABEZA NO.:  [id number]  MEDICAL RECORD NO.:  [id number]  LOCATION:  3711                         FACILITY:  [facility]  PHYSICIAN:  Duke Salvia, MD, FACCDATE OF BIRTH:  01/07/1914  DATE OF ADMISSION:  05/22/2011 DATE OF DISCHARGE:  05/26/2011                        DISCHARGE SUMMARY - REFERRING   PRIMARY CARE PHYSICIAN:  Corwin Levins, MD  PRIMARY CARDIOLOGIST:  [name]. Excell Seltzer, MD  ELECTROPHYSIOLOGIST:  Duke Salvia, MD, [facility]  PRIMARY DIAGNOSIS:  Syncope with bradycardia.  SECONDARY DIAGNOSES: 1. Moderate-to-severe aortic stenosis. 2. Hypertension. 3. Hypothyroidism. 4. History of chronic diastolic heart failure. 5. Hyperlipidemia. 6. Glaucoma. 7. Anemia. 8. Anxiety. 9. Osteoporosis.  ALLERGIES:  The patient has no known drug allergies.  PROCEDURE THIS ADMISSION: 1. CT scan of the head on May 22, 2011, demonstrated small-vessel     ischemic disease and brain atrophy with no acute abnormalities. 2. CT scan of the cervical spine on May 22, 2011, demonstrated no     acute findings. 3. Implantation of a temporary transvenous pacing wire on May 23, 2011, by Dr. Mayford Knife. 4. Implantation of a dual-chamber pacemaker on May 24, 2011, by Dr.     Graciela Husbands.  The patient received a Medtronic model ADDRL1, model number     1944 right atrial lead and model number 1948 right ventricular     lead.  The patient had no early apparent complications. 5. Chest x-ray on May 25, 2011, demonstrated no pneumothorax status     post pacemaker implant.  BRIEF HISTORY OF PRESENT ILLNESS:  Ms. Bedgood is a 77 year old female with a history of moderate-to-severe aortic stenosis, chronic diastolic heart failure, and sinus bradycardia, who has been having episodic dizzy spells for several months.  On the day of admission while walking in her bathroom, she had a syncopal spell and at which time she fell backward, then hit her head.  She was found by the  nurse's aide and EMS was called and the patient was transferred to [facility] Emergency Room.  She was evaluated in the emergency room by Dr. Mayford Knife  who recommended admission to [facility] with plan to allow her Lopressor to washout to see if her bradycardia resolves.  HOSPITAL COURSE:  The patient was admitted on May 22, 2011, for syncope with symptomatic bradycardia.  She was monitored on telemetry, which demonstrated sinus pauses.  At which point, a temporary transvenous pacing wire was placed by Dr. Mayford Knife.  She was evaluated by Electrophysiology on May 24, 2011, and was felt that pacemaker implantation was warranted because of intermittent sinus node arrest with syncope.  She underwent pacemaker implant on May 24, 2011, by Dr. Graciela Husbands.  She continued to be monitored on telemetry, which demonstrated atrial pacing.  Her left chest was without hematoma or ecchymosis.  A chest x-ray post procedure demonstrated no pneumothorax status post pacemaker implant.  The patient's device was interrogated and found to be functioning normally.  She was ambulated and considered to be stable for discharge on May 26, 2011, back to her assisted living.  FOLLOWUP APPOINTMENTS: 1. [facility] on June 03, 2011, at 10:30 a.m. 2.  Dr. Graciela Husbands in 3 months - the office will call to schedule that     appointment. 3. Dr. Excell Seltzer as scheduled. 4. Dr. Jonny Ruiz as scheduled.  DISCHARGE INSTRUCTIONS: 1. Increase activity slowly. 2. Follow low-sodium, heart-healthy diet. 3. See supplemental device discharge instructions for wound care and     arm mobility. 4. Keep incision clean and dry for 1 week.  DISCHARGE MEDICATIONS: 1. Fortical nasal spray 1 spray daily. 2. Calcium/vitamin D 1 tablet twice daily. 3. Citalopram 10 mg daily. 4. Fish oil 1 g daily. 5. Levothroid 15 mcg daily. 6. Lisinopril 20 mg twice daily. 7. Metoprolol tartrate 25 mg twice daily. 8. Mirtazapine 30 mg daily at  bedtime. 9. Multivitamin daily. 10.Norvasc 5 mg daily. 11.Senokot-S daily. 12.Ambien 5 mg daily at bedtime as needed.  DISPOSITION:  The patient was seen and examined by Dr. Graciela Husbands on May 26, 2011, and was considered stable for discharge.  DURATION OF DISCHARGE ENCOUNTER:  Thirty five minutes.     Gypsy Balsam, RN,BSN   ______________________________ Duke Salvia, MD, [facility]    AS/MEDQ  D:  05/26/2011  T:  05/26/2011  Job:  [id number]  cc:   Corwin Levins, MD [name]. Excell Seltzer, MD  Electronically Signed by Gypsy Balsam RNBSN on 05/31/2011 09:47:52 AM Electronically Signed by Sherryl Manges MD [facility] on 06/15/2011 04:32:04 PM

## 2011-12-27 ENCOUNTER — Encounter: Payer: Self-pay | Admitting: *Deleted

## 2012-08-30 ENCOUNTER — Encounter: Payer: Medicare Other | Admitting: Cardiology

## 2012-10-20 ENCOUNTER — Encounter: Payer: Medicare Other | Admitting: Internal Medicine

## 2013-05-25 ENCOUNTER — Encounter: Payer: Self-pay | Admitting: Internal Medicine

## 2013-05-25 ENCOUNTER — Telehealth: Payer: Self-pay | Admitting: Internal Medicine

## 2013-05-25 NOTE — Telephone Encounter (Signed)
05-25-13 sent past due letter, certified mail/mt

## 2013-06-12 ENCOUNTER — Ambulatory Visit (INDEPENDENT_AMBULATORY_CARE_PROVIDER_SITE_OTHER): Payer: Medicare Other | Admitting: Cardiology

## 2013-06-12 ENCOUNTER — Encounter: Payer: Self-pay | Admitting: Internal Medicine

## 2013-06-12 ENCOUNTER — Encounter: Payer: Self-pay | Admitting: Cardiology

## 2013-06-12 VITALS — BP 154/80 | HR 82 | Ht 63.0 in | Wt 130.0 lb

## 2013-06-12 DIAGNOSIS — I350 Nonrheumatic aortic (valve) stenosis: Secondary | ICD-10-CM

## 2013-06-12 DIAGNOSIS — Z950 Presence of cardiac pacemaker: Secondary | ICD-10-CM

## 2013-06-12 DIAGNOSIS — I498 Other specified cardiac arrhythmias: Secondary | ICD-10-CM

## 2013-06-12 DIAGNOSIS — R001 Bradycardia, unspecified: Secondary | ICD-10-CM

## 2013-06-12 DIAGNOSIS — I5032 Chronic diastolic (congestive) heart failure: Secondary | ICD-10-CM

## 2013-06-12 DIAGNOSIS — I359 Nonrheumatic aortic valve disorder, unspecified: Secondary | ICD-10-CM

## 2013-06-12 DIAGNOSIS — I471 Supraventricular tachycardia: Secondary | ICD-10-CM

## 2013-06-12 LAB — PACEMAKER DEVICE OBSERVATION
AL IMPEDENCE PM: 607 Ohm
ATRIAL PACING PM: 93.8
BAMS-0001: 150 {beats}/min
RV LEAD THRESHOLD: 0.5 V

## 2013-06-12 NOTE — Progress Notes (Signed)
ELECTROPHYSIOLOGY OFFICE NOTE  Patient ID: [name] MRN: [id number], DOB/AGE: 77/77/1915   Date of Visit: 06/12/2013  Primary Physician: No primary provider on file. Primary Cardiologist: Excell Seltzer, MD / Graciela Husbands, MD Reason for Visit: EP/device follow-up  History of Present Illness  [name] is a 79 year old woman with symptomatic bradycardia/SND s/p PPM implant June 2012, moderate to severe AS by echo 2010, chronic diastolic HF and HTN who presents today for electrophysiology followup. She has been lost to follow-up since June 2012. She is accompanied by her daughter who assists with history questions. She reports she is doing well. Today, she denies chest pain or shortness of breath. She denies palpitations, dizziness, near syncope or syncope. She denies LE swelling, orthopnea, PND or recent weight gain.   Past Medical History  Patient Active Problem List   Diagnosis Date Noted  . AORTIC STENOSIS/ INSUFFICIENCY, NON-RHEUMATIC 11/03/2009  . ANEMIA OF CHRONIC DISEASE 08/16/2009  . BRADYCARDIA 08/16/2009  . CONGESTIVE HEART FAILURE, ACUTE, SYSTOLIC 08/16/2009  . FEVER UNSPECIFIED 08/11/2009  . VITAMIN B12 DEFICIENCY 04/10/2008  . ANEMIA-NOS 04/09/2008  . INSOMNIA-SLEEP DISORDER-UNSPEC 04/09/2008  . FATIGUE 04/09/2008  . HYPOTHYROIDISM 07/22/2007  . ANXIETY 07/22/2007  . GLAUCOMA NEC 07/22/2007  . HYPERTENSION 07/22/2007  . OSTEOPOROSIS 07/22/2007  . SYNCOPE 07/22/2007  . INSOMNIA, HX OF 07/22/2007   Past Surgical History   .  S/P PPM IMPLANT June 2012  Allergies/Intolerances No Known Allergies  Current Home Medications Current Outpatient Prescriptions  Medication Sig Dispense Refill  . amLODipine (NORVASC) 5 MG tablet Take 5 mg by mouth daily.        . calcitonin, salmon, (MIACALCIN/FORTICAL) 200 UNIT/ACT nasal spray Place 1 spray into the nose daily.        . Calcium Carbonate-Vitamin D (CALCIUM-D PO) Take by mouth 2 (two) times daily.        . citalopram (CELEXA)  10 MG tablet Take 10 mg by mouth daily.        . fish oil-omega-3 fatty acids 1000 MG capsule Take 2 g by mouth daily.        . Levothyroxine Sodium (LEVOTHROID PO) Take 15 mcg by mouth daily.        Marland Kitchen lisinopril (PRINIVIL,ZESTRIL) 20 MG tablet Take 20 mg by mouth 2 (two) times daily.        . metoprolol tartrate (LOPRESSOR) 25 MG tablet Take 25 mg by mouth 2 (two) times daily.        . mirtazapine (REMERON) 30 MG tablet Take 30 mg by mouth at bedtime.        . Multiple Vitamin (MULTIVITAMIN) tablet Take 1 tablet by mouth daily.        . sennosides-docusate sodium (SENOKOT-S) 8.6-50 MG tablet Take 1 tablet by mouth daily.        . temazepam (RESTORIL) 7.5 MG capsule Take 7.5 mg by mouth at bedtime as needed for sleep.       No current facility-administered medications for this visit.   Social History Social History  . Marital Status: Widowed   Social History Main Topics  . Smoking status: Never Smoker   . Smokeless tobacco: No  . Alcohol Use: No  . Drug Use: No   Review of Systems General: No chills, fever, night sweats or weight changes Cardiovascular: No chest pain, dyspnea on exertion, edema, orthopnea, palpitations, paroxysmal nocturnal dyspnea Dermatological: No rash, lesions or masses Respiratory: No cough, dyspnea Urologic: No hematuria, dysuria Abdominal: No nausea, vomiting, diarrhea,  bright red blood per rectum, melena, or hematemesis Neurologic: No visual changes, weakness, changes in mental status All other systems reviewed and are otherwise negative except as noted above.  Physical Exam Blood pressure 154/80, pulse 82, height 5\' 3"  (1.6 m), weight 130 lb (58.968 kg).  General: Well developed, well appearing, elderly 79 year old female in no acute distress. HEENT: Normocephalic, atraumatic. EOMs intact. Sclera nonicteric. Oropharynx clear.  Neck: Supple. No JVD. Lungs: Respirations regular and unlabored, CTA bilaterally. No wheezes, rales or rhonchi. Heart: RRR. S1,  S2 present. Harsh III/VI systolic murmur at upper sternal border. No rub, S3 or S4. Abdomen: Soft, non-distended.  Extremities: No clubbing, cyanosis or edema. PT/Radials 2+ and equal bilaterally. Psych: Normal affect. Neuro: Alert and oriented X 3. Moves all extremities spontaneously.   Diagnostics Device interrogation today - Normal device function. Thresholds, sensing, impedances consistent with previous measurements. Device programmed to maximize longevity. 140 mode switch episodes, 9 AHRs, longest 1 hour in duration, EGM consistent with SVT with 1:1 conduction. 26 VHR episodes, longest 5 minutes 56 seconds, EGM consistent with SVT with 1:1 conduction. No evidence of ventricular arrhythmias. Device programmed at appropriate safety margins. Histogram distribution appropriate for patient activity level. Device programmed to optimize intrinsic conduction. Estimated longevity 13 years.   Assessment and Plan 1. Symptomatic bradycardia with sinus node dysfunction s/p PPM implant Normal device function No programming changes made Return for follow-up with Dr. Graciela Husbands in 6 months 2. PSVT Noted on device interrogation Asymptomatic Continue BB and follow 3. Aortic stenosis [name] denies symptoms of CP, SOB/DOE, dizziness, near syncope or syncope and denies HF symptoms In the absence of symptoms or change in clinical status/condition, there is no need to repeat an echocardiogram at this time Continue to monitor for symptoms and keep follow-up with Dr. Excell Seltzer 4. Chronic diastolic HF Stable; continue medical therapy  Signed, Rick Duff, PA-C 06/12/2013, 2:44 PM

## 2013-12-10 ENCOUNTER — Encounter: Payer: Self-pay | Admitting: Internal Medicine

## 2013-12-10 ENCOUNTER — Ambulatory Visit (INDEPENDENT_AMBULATORY_CARE_PROVIDER_SITE_OTHER): Payer: Medicare Other | Admitting: *Deleted

## 2013-12-10 DIAGNOSIS — I498 Other specified cardiac arrhythmias: Secondary | ICD-10-CM

## 2013-12-10 DIAGNOSIS — Z950 Presence of cardiac pacemaker: Secondary | ICD-10-CM

## 2013-12-10 LAB — MDC_IDC_ENUM_SESS_TYPE_INCLINIC
Battery Impedance: 132 Ohm
Battery Remaining Longevity: 139 mo
Battery Voltage: 2.79 V
Brady Statistic AP VS Percent: 96 %
Brady Statistic AS VP Percent: 0 %
Date Time Interrogation Session: 20141229141014
Lead Channel Impedance Value: 597 Ohm
Lead Channel Pacing Threshold Amplitude: 0.5 V
Lead Channel Pacing Threshold Pulse Width: 0.4 ms
Lead Channel Sensing Intrinsic Amplitude: 2 mV
Lead Channel Setting Pacing Amplitude: 2.5 V
Lead Channel Setting Pacing Pulse Width: 0.4 ms

## 2013-12-10 NOTE — Progress Notes (Signed)
Device check in clinic, all functions normal, no changes made, full details in [employer].  ROV w/ Dr. Graciela Husbands in 61mo.

## 2014-07-03 ENCOUNTER — Encounter: Payer: Self-pay | Admitting: *Deleted

## 2015-11-11 ENCOUNTER — Encounter (HOSPITAL_COMMUNITY): Payer: Self-pay

## 2015-11-11 ENCOUNTER — Inpatient Hospital Stay (HOSPITAL_COMMUNITY)
Admission: EM | Admit: 2015-11-11 | Discharge: 2015-11-13 | DRG: 291 | Disposition: A | Discharge status: Skilled Nursing Facility | Payer: Medicare Other | Attending: Internal Medicine | Admitting: Internal Medicine

## 2015-11-11 ENCOUNTER — Emergency Department (HOSPITAL_COMMUNITY): Payer: Medicare Other

## 2015-11-11 DIAGNOSIS — Z87891 Personal history of nicotine dependence: Secondary | ICD-10-CM

## 2015-11-11 DIAGNOSIS — Z950 Presence of cardiac pacemaker: Secondary | ICD-10-CM

## 2015-11-11 DIAGNOSIS — E876 Hypokalemia: Secondary | ICD-10-CM | POA: Diagnosis present

## 2015-11-11 DIAGNOSIS — Z66 Do not resuscitate: Secondary | ICD-10-CM | POA: Diagnosis present

## 2015-11-11 DIAGNOSIS — J440 Chronic obstructive pulmonary disease with acute lower respiratory infection: Secondary | ICD-10-CM | POA: Diagnosis present

## 2015-11-11 DIAGNOSIS — Z96649 Presence of unspecified artificial hip joint: Secondary | ICD-10-CM | POA: Diagnosis present

## 2015-11-11 DIAGNOSIS — M545 Low back pain: Secondary | ICD-10-CM | POA: Diagnosis present

## 2015-11-11 DIAGNOSIS — R0603 Acute respiratory distress: Secondary | ICD-10-CM

## 2015-11-11 DIAGNOSIS — M810 Age-related osteoporosis without current pathological fracture: Secondary | ICD-10-CM | POA: Diagnosis present

## 2015-11-11 DIAGNOSIS — E039 Hypothyroidism, unspecified: Secondary | ICD-10-CM | POA: Diagnosis present

## 2015-11-11 DIAGNOSIS — I5021 Acute systolic (congestive) heart failure: Secondary | ICD-10-CM | POA: Diagnosis present

## 2015-11-11 DIAGNOSIS — J181 Lobar pneumonia, unspecified organism: Secondary | ICD-10-CM

## 2015-11-11 DIAGNOSIS — I110 Hypertensive heart disease with heart failure: Principal | ICD-10-CM | POA: Diagnosis present

## 2015-11-11 DIAGNOSIS — I10 Essential (primary) hypertension: Secondary | ICD-10-CM | POA: Diagnosis present

## 2015-11-11 DIAGNOSIS — E785 Hyperlipidemia, unspecified: Secondary | ICD-10-CM | POA: Diagnosis present

## 2015-11-11 DIAGNOSIS — R0600 Dyspnea, unspecified: Secondary | ICD-10-CM | POA: Diagnosis present

## 2015-11-11 DIAGNOSIS — Z515 Encounter for palliative care: Secondary | ICD-10-CM | POA: Diagnosis present

## 2015-11-11 DIAGNOSIS — J9601 Acute respiratory failure with hypoxia: Secondary | ICD-10-CM | POA: Diagnosis present

## 2015-11-11 DIAGNOSIS — R339 Retention of urine, unspecified: Secondary | ICD-10-CM | POA: Diagnosis present

## 2015-11-11 DIAGNOSIS — I350 Nonrheumatic aortic (valve) stenosis: Secondary | ICD-10-CM | POA: Diagnosis present

## 2015-11-11 DIAGNOSIS — I5032 Chronic diastolic (congestive) heart failure: Secondary | ICD-10-CM | POA: Insufficient documentation

## 2015-11-11 HISTORY — DX: Hyperlipidemia, unspecified: E78.5

## 2015-11-11 HISTORY — DX: Low back pain, unspecified: M54.50

## 2015-11-11 HISTORY — DX: Major depressive disorder, single episode, unspecified: F32.9

## 2015-11-11 HISTORY — DX: Anemia, unspecified: D64.9

## 2015-11-11 HISTORY — DX: Essential (primary) hypertension: I10

## 2015-11-11 HISTORY — DX: Age-related osteoporosis without current pathological fracture: M81.0

## 2015-11-11 HISTORY — DX: Nonrheumatic aortic (valve) stenosis: I35.0

## 2015-11-11 HISTORY — DX: Depression, unspecified: F32.A

## 2015-11-11 HISTORY — DX: Disorder of thyroid, unspecified: E07.9

## 2015-11-11 HISTORY — DX: Anxiety disorder, unspecified: F41.9

## 2015-11-11 HISTORY — DX: Low back pain: M54.5

## 2015-11-11 HISTORY — DX: Heart failure, unspecified: I50.9

## 2015-11-11 HISTORY — DX: Presence of cardiac pacemaker: Z95.0

## 2015-11-11 LAB — BASIC METABOLIC PANEL
Anion gap: 7 (ref 5–15)
BUN: 21 mg/dL — ABNORMAL HIGH (ref 6–20)
CHLORIDE: 105 mmol/L (ref 101–111)
CO2: 24 mmol/L (ref 22–32)
CREATININE: 0.57 mg/dL (ref 0.44–1.00)
Calcium: 9.2 mg/dL (ref 8.9–10.3)
Glucose, Bld: 234 mg/dL — ABNORMAL HIGH (ref 65–99)
POTASSIUM: 4.1 mmol/L (ref 3.5–5.1)
SODIUM: 136 mmol/L (ref 135–145)

## 2015-11-11 LAB — TROPONIN I: Troponin I: 0.09 ng/mL — ABNORMAL HIGH (ref <0.031)

## 2015-11-11 LAB — CBC WITH DIFFERENTIAL/PLATELET
BASOS PCT: 0 %
Basophils Absolute: 0 10*3/uL (ref 0.0–0.1)
EOS ABS: 0 10*3/uL (ref 0.0–0.7)
EOS PCT: 0 %
HCT: 38.1 % (ref 36.0–46.0)
HEMOGLOBIN: 12.4 g/dL (ref 12.0–15.0)
Lymphocytes Relative: 6 %
Lymphs Abs: 1.1 10*3/uL (ref 0.7–4.0)
MCH: 29.6 pg (ref 26.0–34.0)
MCHC: 32.5 g/dL (ref 30.0–36.0)
MCV: 90.9 fL (ref 78.0–100.0)
Monocytes Absolute: 0.9 10*3/uL (ref 0.1–1.0)
Monocytes Relative: 5 %
NEUTROS PCT: 89 %
Neutro Abs: 16.2 10*3/uL — ABNORMAL HIGH (ref 1.7–7.7)
PLATELETS: 153 10*3/uL (ref 150–400)
RBC: 4.19 MIL/uL (ref 3.87–5.11)
RDW: 14.5 % (ref 11.5–15.5)
WBC: 18.2 10*3/uL — AB (ref 4.0–10.5)

## 2015-11-11 LAB — I-STAT ARTERIAL BLOOD GAS, ED
BICARBONATE: 25.3 meq/L — AB (ref 20.0–24.0)
O2 Saturation: 96 %
PH ART: 7.365 (ref 7.350–7.450)
PO2 ART: 84 mmHg (ref 80.0–100.0)
TCO2: 27 mmol/L (ref 0–100)
pCO2 arterial: 44.4 mmHg (ref 35.0–45.0)

## 2015-11-11 LAB — TSH: TSH: 0.582 u[IU]/mL (ref 0.350–4.500)

## 2015-11-11 MED ORDER — POLYETHYLENE GLYCOL 3350 17 G PO PACK: 17.0000 g | PACK | Freq: Every day | ORAL | Status: DC | PRN

## 2015-11-11 MED ORDER — LISINOPRIL 20 MG PO TABS
20.0000 mg | ORAL_TABLET | Freq: Two times a day (BID) | ORAL | Status: DC
  Administered 2015-11-12 – 2015-11-13 (×3): 20 mg via ORAL
  Filled 2015-11-11 (×3): qty 1

## 2015-11-11 MED ORDER — FUROSEMIDE 10 MG/ML IJ SOLN
40.0000 mg | Freq: Every day | INTRAMUSCULAR | Status: DC
  Administered 2015-11-12 (×2): 40 mg via INTRAVENOUS
  Filled 2015-11-11 (×2): qty 4

## 2015-11-11 MED ORDER — ONDANSETRON HCL 4 MG/2ML IJ SOLN: 4.0000 mg | Freq: Four times a day (QID) | INTRAMUSCULAR | Status: DC | PRN

## 2015-11-11 MED ORDER — POLYVINYL ALCOHOL 1.4 % OP SOLN
1.0000 [drp] | Freq: Two times a day (BID) | OPHTHALMIC | Status: DC
  Administered 2015-11-12 – 2015-11-13 (×4): 1 [drp] via OPHTHALMIC
  Filled 2015-11-11: qty 15

## 2015-11-11 MED ORDER — MELATONIN 3 MG PO CAPS
3.0000 mg | ORAL_CAPSULE | Freq: Every evening | ORAL | Status: DC | PRN
  Filled 2015-11-11: qty 1

## 2015-11-11 MED ORDER — SODIUM CHLORIDE 0.9 % IV BOLUS (SEPSIS)
250.0000 mL | Freq: Once | INTRAVENOUS | Status: AC
Start: 2015-11-12 — End: 2015-11-12
  Administered 2015-11-12: 250 mL via INTRAVENOUS

## 2015-11-11 MED ORDER — AMLODIPINE BESYLATE 5 MG PO TABS
5.0000 mg | ORAL_TABLET | Freq: Every day | ORAL | Status: DC
  Administered 2015-11-12 – 2015-11-13 (×2): 5 mg via ORAL
  Filled 2015-11-11 (×2): qty 1

## 2015-11-11 MED ORDER — MIRTAZAPINE 15 MG PO TABS
15.0000 mg | ORAL_TABLET | Freq: Every day | ORAL | Status: DC
  Administered 2015-11-12 (×2): 15 mg via ORAL
  Filled 2015-11-11 (×2): qty 1

## 2015-11-11 MED ORDER — ACETAMINOPHEN 325 MG PO TABS
650.0000 mg | ORAL_TABLET | ORAL | Status: DC | PRN
  Administered 2015-11-12: 650 mg via ORAL
  Filled 2015-11-11: qty 2

## 2015-11-11 MED ORDER — LEVOFLOXACIN IN D5W 750 MG/150ML IV SOLN
750.0000 mg | INTRAVENOUS | Status: DC
  Administered 2015-11-11: 750 mg via INTRAVENOUS
  Filled 2015-11-11: qty 150

## 2015-11-11 MED ORDER — SODIUM CHLORIDE 0.9 % IV SOLN: 250.0000 mL | INTRAVENOUS | Status: DC | PRN

## 2015-11-11 MED ORDER — METOPROLOL TARTRATE 25 MG PO TABS
25.0000 mg | ORAL_TABLET | Freq: Two times a day (BID) | ORAL | Status: DC
  Administered 2015-11-12 – 2015-11-13 (×4): 25 mg via ORAL
  Filled 2015-11-11 (×4): qty 1

## 2015-11-11 MED ORDER — SENNA-DOCUSATE SODIUM 8.6-50 MG PO TABS: 1.0000 | ORAL_TABLET | Freq: Every day | ORAL | Status: DC

## 2015-11-11 MED ORDER — LEVOTHYROXINE SODIUM 75 MCG PO TABS
75.0000 ug | ORAL_TABLET | Freq: Every day | ORAL | Status: DC
  Administered 2015-11-12 – 2015-11-13 (×2): 75 ug via ORAL
  Filled 2015-11-11 (×2): qty 1

## 2015-11-11 MED ORDER — SODIUM CHLORIDE 0.9 % IJ SOLN
3.0000 mL | Freq: Two times a day (BID) | INTRAMUSCULAR | Status: DC
  Administered 2015-11-12 – 2015-11-13 (×2): 3 mL via INTRAVENOUS

## 2015-11-11 MED ORDER — ALBUTEROL SULFATE (2.5 MG/3ML) 0.083% IN NEBU: 2.5000 mg | INHALATION_SOLUTION | Freq: Four times a day (QID) | RESPIRATORY_TRACT | Status: DC | PRN

## 2015-11-11 MED ORDER — PROMETHAZINE HCL 25 MG PO TABS: 25.0000 mg | ORAL_TABLET | Freq: Four times a day (QID) | ORAL | Status: DC | PRN

## 2015-11-11 MED ORDER — SODIUM CHLORIDE 0.9 % IJ SOLN: 3.0000 mL | INTRAMUSCULAR | Status: DC | PRN

## 2015-11-11 MED ORDER — METOPROLOL TARTRATE 1 MG/ML IV SOLN: 2.5000 mg | Freq: Once | INTRAVENOUS | Status: DC

## 2015-11-11 MED ORDER — GLUCERNA SHAKE PO LIQD
237.0000 mL | Freq: Two times a day (BID) | ORAL | Status: DC
  Administered 2015-11-12 – 2015-11-13 (×2): 237 mL via ORAL

## 2015-11-11 MED ORDER — SENNOSIDES-DOCUSATE SODIUM 8.6-50 MG PO TABS
1.0000 | ORAL_TABLET | Freq: Every day | ORAL | Status: DC
  Administered 2015-11-12 – 2015-11-13 (×2): 1 via ORAL
  Filled 2015-11-11 (×2): qty 1

## 2015-11-11 MED ORDER — HEPARIN SODIUM (PORCINE) 5000 UNIT/ML IJ SOLN
5000.0000 [IU] | Freq: Three times a day (TID) | INTRAMUSCULAR | Status: DC
  Administered 2015-11-12 – 2015-11-13 (×5): 5000 [IU] via SUBCUTANEOUS
  Filled 2015-11-11 (×5): qty 1

## 2015-11-11 MED ORDER — POLYVINYL ALCOHOL-POVIDONE 2.7-2 % OP SOLN: 1.0000 [drp] | Freq: Two times a day (BID) | OPHTHALMIC | Status: DC

## 2015-11-11 MED ORDER — ASPIRIN EC 325 MG PO TBEC
325.0000 mg | DELAYED_RELEASE_TABLET | Freq: Every day | ORAL | Status: DC
  Administered 2015-11-12 – 2015-11-13 (×3): 325 mg via ORAL
  Filled 2015-11-11 (×3): qty 1

## 2015-11-11 NOTE — Progress Notes (Signed)
Pt. With HR sustaining in the 140s-150s. [facility]

## 2015-11-11 NOTE — ED Notes (Signed)
Pt eating grahm crackers and apple sauce, putting non re breather on face while chewing,

## 2015-11-11 NOTE — ED Notes (Signed)
Non re breather removed from pt, pt placed on 4 L [place].

## 2015-11-11 NOTE — ED Provider Notes (Signed)
CSN: [id number]     Arrival date & time 11/11/15  1521 History   First MD Initiated Contact with Patient 11/11/15 1528     Chief Complaint  Patient presents with  . Loss of Consciousness   Patient is a [age] y.o. female presenting with shortness of breath. The history is provided by the EMS personnel.  Shortness of Breath Severity:  Moderate Onset quality:  Gradual Timing:  Constant Progression:  Unchanged Chronicity:  New Relieved by:  Oxygen Associated symptoms: cough   Associated symptoms: no abdominal pain, no chest pain, no fever, no headaches, no neck pain, no rash, no sore throat and no vomiting   Associated symptoms comment:  Confusion, altered mental status   Past Medical History  Diagnosis Date  . Thyroid disease   . Hypertension   . CHF (congestive heart failure) (HCC)   . Aortic stenosis   . Low back pain   . Hyperlipidemia   . Anxiety   . Depression   . Osteoporosis   . Anemia    Past Surgical History  Procedure Laterality Date  . Pacemaker insertion    . Hip replace     Family History  Problem Relation Age of Onset  . Family history unknown: Yes   Social History  Substance Use Topics  . Smoking status: Former Games developermoker  . Smokeless tobacco: None  . Alcohol Use: No   OB History    No data available     Review of Systems  Constitutional: Negative for fever.  HENT: Negative for rhinorrhea and sore throat.   Eyes: Negative for visual disturbance.  Respiratory: Positive for cough and shortness of breath. Negative for chest tightness.   Cardiovascular: Negative for chest pain and palpitations.  Gastrointestinal: Negative for nausea, vomiting, abdominal pain and constipation.  Genitourinary: Negative for dysuria and hematuria.  Musculoskeletal: Negative for back pain and neck pain.  Skin: Negative for rash.  Neurological: Positive for syncope. Negative for dizziness and headaches.  Psychiatric/Behavioral: Positive for confusion.  All other systems  reviewed and are negative.  Allergies  Review of patient's allergies indicates no known allergies.  Home Medications   Prior to Admission medications   Medication Sig Start Date End Date Taking? Authorizing Provider  albuterol (PROVENTIL) (2.5 MG/3ML) 0.083% nebulizer solution Take 2.5 mg by nebulization every 6 (six) hours as needed for wheezing or shortness of breath.   Yes Historical Provider, MD  amLODipine (NORVASC) 5 MG tablet Take 5 mg by mouth daily.     Yes Historical Provider, MD  feeding supplement, GLUCERNA SHAKE, (GLUCERNA SHAKE) LIQD Take 237 mLs by mouth 2 (two) times daily between meals.   Yes Historical Provider, MD  levothyroxine (SYNTHROID, LEVOTHROID) 75 MCG tablet Take 75 mcg by mouth daily before breakfast.   Yes Historical Provider, MD  lisinopril (PRINIVIL,ZESTRIL) 20 MG tablet Take 20 mg by mouth 2 (two) times daily.     Yes Historical Provider, MD  Melatonin 3 MG CAPS Take 3 mg by mouth at bedtime as needed (sleep).   Yes Historical Provider, MD  metoprolol tartrate (LOPRESSOR) 25 MG tablet Take 25 mg by mouth 2 (two) times daily.     Yes Historical Provider, MD  mirtazapine (REMERON) 15 MG tablet Take 15 mg by mouth at bedtime.   Yes Historical Provider, MD  Multiple Vitamins-Minerals (PRESERVISION AREDS 2 PO) Take 1 capsule by mouth 2 (two) times daily.   Yes Historical Provider, MD  polyethylene glycol (MIRALAX / GLYCOLAX) packet Take 17 g  by mouth daily as needed for mild constipation or moderate constipation.   Yes Historical Provider, MD  Polyvinyl Alcohol-Povidone (FRESHKOTE) 2.7-2 % SOLN Apply 1 drop to eye 2 (two) times daily.   Yes Historical Provider, MD  promethazine (PHENERGAN) 25 MG tablet Take 25 mg by mouth every 6 (six) hours as needed for nausea or vomiting.  09/09/15  Yes Historical Provider, MD  sennosides-docusate sodium (SENOKOT-S) 8.6-50 MG tablet Take 1 tablet by mouth daily.     Yes Historical Provider, MD   BP 149/65 mmHg  Pulse 89   Temp(Src) 98.5 F (36.9 C) (Oral)  Resp 26  Ht  (1.6 m)  Wt 45.36 kg  BMI 17.72 kg/m2  SpO2 100% Physical Exam  Constitutional: She is oriented to person, place, and time. She appears distressed.  Chronically ill appearing female  HENT:  Head: Normocephalic and atraumatic.  Mouth/Throat: Oropharynx is clear and moist.  Eyes: EOM are normal. Pupils are equal, round, and reactive to light.  Neck: Neck supple. No JVD present.  Cardiovascular: Normal heart sounds and intact distal pulses.  An irregularly irregular rhythm present. Tachycardia present.  Exam reveals no gallop.   No murmur heard. Pulmonary/Chest: Accessory muscle usage present. Tachypnea noted. She has decreased breath sounds. She has no wheezes. She has rales (bilateral).  Abdominal: Soft. She exhibits no distension. There is no tenderness.  Musculoskeletal: Normal range of motion. She exhibits no tenderness.  Neurological: She is alert and oriented to person, place, and time. No cranial nerve deficit. She exhibits normal muscle tone.  Skin: Skin is warm and dry. No rash noted.  Psychiatric: Her behavior is normal.    ED Course  Procedures  None   Labs Review Labs Reviewed  CBC WITH DIFFERENTIAL/PLATELET - Abnormal; Notable for the following:    WBC 18.2 (*)    Neutro Abs 16.2 (*)    All other components within normal limits  BASIC METABOLIC PANEL - Abnormal; Notable for the following:    Glucose, Bld 234 (*)    BUN 21 (*)    All other components within normal limits  TROPONIN I - Abnormal; Notable for the following:    Troponin I 0.09 (*)    All other components within normal limits  I-STAT ARTERIAL BLOOD GAS, ED - Abnormal; Notable for the following:    Bicarbonate 25.3 (*)    All other components within normal limits  MRSA PCR SCREENING  CULTURE, EXPECTORATED SPUTUM-ASSESSMENT  GRAM STAIN  TSH  BASIC METABOLIC PANEL  HIV ANTIBODY (ROUTINE TESTING)  LEGIONELLA PNEUMOPHILA SEROGP 1 UR AG  STREP  PNEUMONIAE URINARY ANTIGEN  TROPONIN I  TROPONIN I  INFLUENZA PANEL BY PCR (TYPE A & B, H1N1)    Imaging Review Dg Chest Port 1 View  11/11/2015  CLINICAL DATA:  Cough, shortness of Breath EXAM: PORTABLE CHEST 1 VIEW COMPARISON:  05/25/2011 FINDINGS: Study is limited by patient's rotation. Dual lead cardiac pacemaker is unchanged in position. Hazy bilateral airspace disease left greater than right suspicious for asymmetric edema or pneumonia. IMPRESSION: Hazy bilateral airspace disease left greater than right suspicious for asymmetric pulmonary edema or pneumonia. Electronically Signed   By: Natasha Mead M.D.   On: 11/11/2015 17:01   I have personally reviewed and evaluated these images and lab results as part of my medical decision-making.   EKG Interpretation   Date/Time:  Tuesday November 11 2015 15:30:48 EST Ventricular Rate:  109 PR Interval:  196 QRS Duration: 119 QT Interval:  338  QTC Calculation: 455 R Axis:   -112 Text Interpretation:  Atrial fibrillation LAD, consider left anterior  fascicular block Consider left ventricular hypertrophy Baseline wander in  lead(s) I III aVL V3 V6 Poor data quality Confirmed by Denton Lank  MD, Caryn Bee  ([id number]) on 11/11/2015 3:54:18 PM      MDM   Final diagnoses:  Respiratory distress    81 year old F nursing home patient presents with AMS and respiratory difficulty. H/o multiple chronic comorbidities including CHF. Patient arrives by EMS with documentation noting DNR/DNI wishes. Daughter Bonita Quin is here to help provide further information regarding goals of care. Patient does not desire IVFs given CHF. She is hypoxic, tachypneic, tachycardic with diffuse bilateral crackles on exam. Afebrile. Suspect this is pulmonary edema 2/2 heart failure. Palliative care consulted and Hospitalist contacted for admission. Will admit for further management and palliation. Discussed prognosis with family.   Discussed with Dr. Denton Lank.  Maris Berger,  MD 11/12/15 6045  Cathren Laine, MD 11/12/15 [phone]

## 2015-11-11 NOTE — ED Notes (Signed)
Pt is maintaining oxygen saturation level above 92% while on 4 L [place].

## 2015-11-11 NOTE — ED Notes (Signed)
Pt arrives EMS with c/[facility]um[facility]. Sat 65 on RA up to 94 on NRM. MOST Document states no IV so none placed by EMS.CBG 299 by EMS. Resp labored with coarse breath sounds. Pt states she feels she has to work hard top breath

## 2015-11-11 NOTE — ED Notes (Signed)
Pt states that she is ok with receiving antibiotics.

## 2015-11-11 NOTE — H&P (Signed)
[facility] History and Physical  [name] ZOX:[id number] DOB: 1913/05/08 DOA: 11/11/2015  Referring physician: Jake Samples, MD PCP: No primary care provider on file.   Chief Complaint: Weakness  HPI: [name] is a 81 y.o. female with prior history of CHF COPD HTN Anxiety AS depression Hypothyroid presents to the ED with weakness. The family just left and the patient is a poor historian. She states that she lost her balance today. She states that she went down but states that nothing hurts at this time. She states that she lives at [facility]. Patient was noted at that time to have a low saturation. According to the notes she was found slumped on her toilet seat. Patient has been complaining of feeling weak overall. Patient states that she has no fevers noted. She has noted no cough. She has no chest pain. She does have shortness on breath noted. Patient has been under palliative care and hospice care in the past. Patient is currently on 100% NRB with her saturations in the 98-99% range.   Review of Systems:  Complete 12 point ROS performed and is unremarkable other than HPI.   Past Medical History  Diagnosis Date  . Thyroid disease   . Hypertension   . CHF (congestive heart failure) (HCC)   . Aortic stenosis   . Low back pain   . Hyperlipidemia   . Anxiety   . Depression   . Osteoporosis   . Anemia    Past Surgical History  Procedure Laterality Date  . Pacemaker insertion    . Hip replace     Social History:  reports that she has quit smoking. She does not have any smokeless tobacco history on file. She reports that she does not drink alcohol or use illicit drugs.  No Known Allergies  Family History  Problem Relation Age of Onset  . Family history unknown: Yes     Prior to Admission medications   Medication Sig Start Date End Date Taking? Authorizing Provider  albuterol (PROVENTIL) (2.5 MG/3ML) 0.083% nebulizer solution Take 2.5 mg by  nebulization every 6 (six) hours as needed for wheezing or shortness of breath.   Yes Historical Provider, MD  amLODipine (NORVASC) 5 MG tablet Take 5 mg by mouth daily.     Yes Historical Provider, MD  feeding supplement, GLUCERNA SHAKE, (GLUCERNA SHAKE) LIQD Take 237 mLs by mouth 2 (two) times daily between meals.   Yes Historical Provider, MD  levothyroxine (SYNTHROID, LEVOTHROID) 75 MCG tablet Take 75 mcg by mouth daily before breakfast.   Yes Historical Provider, MD  lisinopril (PRINIVIL,ZESTRIL) 20 MG tablet Take 20 mg by mouth 2 (two) times daily.     Yes Historical Provider, MD  Melatonin 3 MG CAPS Take 3 mg by mouth at bedtime as needed (sleep).   Yes Historical Provider, MD  metoprolol tartrate (LOPRESSOR) 25 MG tablet Take 25 mg by mouth 2 (two) times daily.     Yes Historical Provider, MD  mirtazapine (REMERON) 15 MG tablet Take 15 mg by mouth at bedtime.   Yes Historical Provider, MD  Multiple Vitamins-Minerals (PRESERVISION AREDS 2 PO) Take 1 capsule by mouth 2 (two) times daily.   Yes Historical Provider, MD  polyethylene glycol (MIRALAX / GLYCOLAX) packet Take 17 g by mouth daily as needed for mild constipation or moderate constipation.   Yes Historical Provider, MD  Polyvinyl Alcohol-Povidone (FRESHKOTE) 2.7-2 % SOLN Apply 1 drop to eye 2 (two) times daily.   Yes  Historical Provider, MD  promethazine (PHENERGAN) 25 MG tablet Take 25 mg by mouth every 6 (six) hours as needed for nausea or vomiting.  09/09/15  Yes Historical Provider, MD  sennosides-docusate sodium (SENOKOT-S) 8.6-50 MG tablet Take 1 tablet by mouth daily.     Yes Historical Provider, MD   Physical Exam: Filed Vitals:   11/11/15 1600 11/11/15 1630 11/11/15 1750 11/11/15 1800  BP: 158/86 141/72 120/64 142/69  Pulse: 76 95 91 93  Temp:      TempSrc:      Resp: 22 29 36 23  Height:      Weight:      SpO2: 93% 95% 98% 98%    Wt Readings from Last 3 Encounters:  11/11/15 45.36 kg (100 lb)  06/12/13 58.968 kg  (130 lb)  10/16/09 48.648 kg (107 lb 4 oz)    General:  Appears calm and comfortable Eyes: PERRL, normal lids, irises & conjunctiva ENT: grossly normal hearing, lips & tongue Neck: no LAD, masses or thyromegaly Cardiovascular: RRR, +++Murmer. No LE edema Respiratory:  no w/r/r. Normal respiratory effort. Abdomen: soft, ntnd Skin: no rash or induration seen on limited exam Musculoskeletal: grossly normal tone BUE/BLE Psychiatric: grossly normal mood and affect Neurologic: grossly non-focal.          Labs on Admission:  Basic Metabolic Panel:  Recent Labs Lab 11/11/15 1614  NA 136  K 4.1  CL 105  CO2 24  GLUCOSE 234*  BUN 21*  CREATININE 0.57  CALCIUM 9.2   Liver Function Tests: No results for input(s): AST, ALT, ALKPHOS, BILITOT, PROT, ALBUMIN in the last 168 hours. No results for input(s): LIPASE, AMYLASE in the last 168 hours. No results for input(s): AMMONIA in the last 168 hours. CBC:  Recent Labs Lab 11/11/15 1614  WBC 18.2*  NEUTROABS 16.2*  HGB 12.4  HCT 38.1  MCV 90.9  PLT 153   Cardiac Enzymes: No results for input(s): CKTOTAL, CKMB, CKMBINDEX, TROPONINI in the last 168 hours.  BNP (last 3 results) No results for input(s): BNP in the last 8760 hours.  ProBNP (last 3 results) No results for input(s): PROBNP in the last 8760 hours.  CBG: No results for input(s): GLUCAP in the last 168 hours.  Radiological Exams on Admission: Dg Chest Port 1 View  11/11/2015  CLINICAL DATA:  Cough, shortness of Breath EXAM: PORTABLE CHEST 1 VIEW COMPARISON:  05/25/2011 FINDINGS: Study is limited by patient's rotation. Dual lead cardiac pacemaker is unchanged in position. Hazy bilateral airspace disease left greater than right suspicious for asymmetric edema or pneumonia. IMPRESSION: Hazy bilateral airspace disease left greater than right suspicious for asymmetric pulmonary edema or pneumonia. Electronically Signed   By: Natasha Mead M.D.   On: 11/11/2015 17:01       Assessment/Plan Principal Problem:   CONGESTIVE HEART FAILURE, ACUTE, SYSTOLIC Active Problems:   Hypothyroidism   Essential hypertension   Lobar pneumonia (HCC)   1. Systolic Heart failure -started on heart failure pathway -diurese as tolerated -already on ACE -continue with beta blocker -ASA -last echo done in 2010 will repeat Echo unless family decides palliation  2. Possible lobar pneumonia -CXR not entirely convincing and is a poor quality study -CXR has atypical appearance will therefore start on empiric antibiotics for now -follow up CXR as needed  3. Hypothyroid -on synthroid -check TSH  4. HTN -will continue with norvasc -continue with lisinopril and norvasc  5. COPD -will continue with nebulizer as needed -continue with oxygen therapy  Code Status: DNR DVT Prophylaxis:Heparin Family Communication: none Disposition Plan: Palliative care possible Hospice   [facility]vasive SurgeryKHAN,SAADAT A [facility] Pager [phone]

## 2015-11-12 ENCOUNTER — Other Ambulatory Visit (HOSPITAL_COMMUNITY): Payer: Medicare Other

## 2015-11-12 ENCOUNTER — Encounter (HOSPITAL_COMMUNITY): Payer: Self-pay | Admitting: General Practice

## 2015-11-12 DIAGNOSIS — J9601 Acute respiratory failure with hypoxia: Secondary | ICD-10-CM

## 2015-11-12 LAB — INFLUENZA PANEL BY PCR (TYPE A & B)
H1N1 flu by pcr: NOT DETECTED
INFLAPCR: NEGATIVE
Influenza B By PCR: NEGATIVE

## 2015-11-12 LAB — TROPONIN I
TROPONIN I: 0.09 ng/mL — AB (ref <0.031)
Troponin I: 0.07 ng/mL — ABNORMAL HIGH (ref <0.031)

## 2015-11-12 LAB — BASIC METABOLIC PANEL
Anion gap: 7 (ref 5–15)
BUN: 15 mg/dL (ref 6–20)
CALCIUM: 8.9 mg/dL (ref 8.9–10.3)
CO2: 30 mmol/L (ref 22–32)
CREATININE: 0.54 mg/dL (ref 0.44–1.00)
Chloride: 100 mmol/L — ABNORMAL LOW (ref 101–111)
GFR calc non Af Amer: >60 mL/min (ref >60)
GLUCOSE: 123 mg/dL — AB (ref 65–99)
Potassium: 3.3 mmol/L — ABNORMAL LOW (ref 3.5–5.1)
Sodium: 137 mmol/L (ref 135–145)

## 2015-11-12 LAB — BRAIN NATRIURETIC PEPTIDE: B Natriuretic Peptide: 1451 pg/mL — ABNORMAL HIGH (ref 0.0–100.0)

## 2015-11-12 LAB — HIV ANTIBODY (ROUTINE TESTING W REFLEX): HIV Screen 4th Generation wRfx: NONREACTIVE

## 2015-11-12 LAB — MRSA PCR SCREENING: MRSA BY PCR: NEGATIVE

## 2015-11-12 LAB — STREP PNEUMONIAE URINARY ANTIGEN: STREP PNEUMO URINARY ANTIGEN: NEGATIVE

## 2015-11-12 MED ORDER — HYDROCODONE-ACETAMINOPHEN 5-325 MG PO TABS
1.0000 | ORAL_TABLET | Freq: Four times a day (QID) | ORAL | Status: DC | PRN
  Administered 2015-11-12 – 2015-11-13 (×3): 1 via ORAL
  Filled 2015-11-12 (×3): qty 1

## 2015-11-12 MED ORDER — POTASSIUM CHLORIDE CRYS ER 20 MEQ PO TBCR
40.0000 meq | EXTENDED_RELEASE_TABLET | Freq: Once | ORAL | Status: AC
  Administered 2015-11-12: 40 meq via ORAL
  Filled 2015-11-12: qty 2

## 2015-11-12 NOTE — Progress Notes (Signed)
Talked to daughter about DCP Patient is a resident of [facility] for 7 yrs and daughter plans to have patient return there at discharge. SW to follow up with placement; B Shelba Flakehandler RN,MHA,BSN [phone]

## 2015-11-12 NOTE — Clinical Social Work Note (Signed)
Clinical Social Work Assessment  Patient Details  Name: [name] MRN: [id number] Date of Birth: [birth year]  Date of referral:  11/12/15               Reason for consult:  Facility Placement                Permission sought to share information with:  Facility Medical sales representativeContact Representative, Family Supports Permission granted to share information::  Yes, Verbal Permission Granted  Name::     Music therapistLinda  Agency::  [facility]  Relationship::  dtr  Contact Information:     Housing/Transportation Living arrangements for the past 2 months:  Skilled Building surveyorursing Facility Source of Information:  Adult Children Patient Interpreter Needed:  None Criminal Activity/Legal Involvement Pertinent to Current Situation/Hospitalization:  No - Comment as needed Significant Relationships:  Adult Children Lives with:  Facility Resident Do you feel safe going back to the place where you live?  Yes Need for family participation in patient care:  Yes (Comment)  Care giving concerns:  None- [facility]   Social Worker assessment / plan:  CSW spoke with pt dtr concerning plan for pt return to [facility].  Employment status:  Retired Database administratornsurance information:  Managed Medicare PT Recommendations:  Not assessed at this time Information / Referral to community resources:     Patient/Family's Response to care: Pt and dtr are agreeable to return to [facility] at time of DC- pt has lived there for 7 years and is happy with her care there.  Patient/Family's Understanding of and Emotional Response to Diagnosis, Current Treatment, and Prognosis: no questions or concerns- pt and family still wanting non-aggressive care.  Emotional Assessment Appearance:  Appears stated age Attitude/Demeanor/Rapport:    Affect (typically observed):  Appropriate Orientation:  Oriented to Self, Oriented to Place, Oriented to  Time, Oriented to Situation Alcohol / Substance use:  Not Applicable Psych involvement (Current  and /or in the community):  No (Comment)  Discharge Needs  Concerns to be addressed:  Care Coordination Readmission within the last 30 days:    Current discharge risk:  None Barriers to Discharge:  Continued Medical Work up   Peabody EnergyHoloman, Nazaire Cordial M, LCSW 11/12/2015, 2:48 PM

## 2015-11-12 NOTE — Progress Notes (Signed)
PROGRESS NOTE  PIPPA HANIF ZOX:[id number] DOB: 1913-01-11 DOA: 11/11/2015 PCP: Julian Hy, MD  Assessment/Plan: Acute resp failure -wean O2 as tolerated  PNA -IV Abx  Hypothyroid synthroid  Acute urinary retention -may need foley placed  Admitting dr said acute CHF-- last echo was 2010 and EF 60-65%. Check BNP, no sign of LE edema, continue IV lasix for now  Hypokalemia -replete   Spoke with daughter who does not want to pursue aggressive care-- will not do echo, troponins flat, hope to d/c back to nursing home in AM   Code Status: DNR Family Communication: daughter at bedside Disposition Plan:    Consultants:    Procedures:      HPI/Subjective: Much improved today and daughter anxious to get her back to [facility] lower back pain   Objective: Filed Vitals:   11/12/15 0055 11/12/15 0420  BP: 140/69 136/95  Pulse:  79  Temp:  97.5 F (36.4 C)  Resp:  19    Intake/Output Summary (Last 24 hours) at 11/12/15 1154 Last data filed at 11/12/15 0852  Gross per 24 hour  Intake    480 ml  Output   1300 ml  Net   -820 ml   Filed Weights   11/11/15 1534 11/11/15 2218 11/12/15 0420  Weight: 45.36 kg (100 lb) 37.422 kg (82 lb 8 oz) 36.877 kg (81 lb 4.8 oz)    Exam:   General:  Awake, NAD  Cardiovascular: +murmur  Respiratory: no wheezing, poor effort  Abdomen: +BS, soft  Musculoskeletal: no edema  Data Reviewed: Basic Metabolic Panel:  Recent Labs Lab 11/11/15 1614 11/12/15 0348  NA 136 137  K 4.1 3.3*  CL 105 100*  CO2 24 30  GLUCOSE 234* 123*  BUN 21* 15  CREATININE 0.57 0.54  CALCIUM 9.2 8.9   Liver Function Tests: No results for input(s): AST, ALT, ALKPHOS, BILITOT, PROT, ALBUMIN in the last 168 hours. No results for input(s): LIPASE, AMYLASE in the last 168 hours. No results for input(s): AMMONIA in the last 168 hours. CBC:  Recent Labs Lab 11/11/15 1614  WBC 18.2*  NEUTROABS 16.2*  HGB 12.4  HCT 38.1    MCV 90.9  PLT 153   Cardiac Enzymes:  Recent Labs Lab 11/11/15 2212 11/12/15 0348 11/12/15 1015  TROPONINI 0.09* 0.09* 0.07*   BNP (last 3 results) No results for input(s): BNP in the last 8760 hours.  ProBNP (last 3 results) No results for input(s): PROBNP in the last 8760 hours.  CBG: No results for input(s): GLUCAP in the last 168 hours.  Recent Results (from the past 240 hour(s))  MRSA PCR Screening     Status: None   Collection Time: 11/11/15  8:21 PM  Result Value Ref Range Status   MRSA by PCR NEGATIVE NEGATIVE Final    Comment:        The GeneXpert MRSA Assay (FDA approved for NASAL specimens only), is one component of a comprehensive MRSA colonization surveillance program. It is not intended to diagnose MRSA infection nor to guide or monitor treatment for MRSA infections.      Studies: Dg Chest Port 1 View  11/11/2015  CLINICAL DATA:  Cough, shortness of Breath EXAM: PORTABLE CHEST 1 VIEW COMPARISON:  05/25/2011 FINDINGS: Study is limited by patient's rotation. Dual lead cardiac pacemaker is unchanged in position. Hazy bilateral airspace disease left greater than right suspicious for asymmetric edema or pneumonia. IMPRESSION: Hazy bilateral airspace disease left greater than right suspicious for asymmetric pulmonary edema  or pneumonia. Electronically Signed   By: Natasha MeadLiviu  Pop M.D.   On: 11/11/2015 17:01    Scheduled Meds: . amLODipine  5 mg Oral Daily  . aspirin EC  325 mg Oral Daily  . feeding supplement (GLUCERNA SHAKE)  237 mL Oral BID BM  . furosemide  40 mg Intravenous Daily  . heparin  5,000 Units Subcutaneous 3 times per day  . levofloxacin (LEVAQUIN) IV  750 mg Intravenous Q48H  . levothyroxine  75 mcg Oral QAC breakfast  . lisinopril  20 mg Oral BID  . metoprolol tartrate  25 mg Oral BID  . mirtazapine  15 mg Oral QHS  . polyvinyl alcohol  1 drop Both Eyes BID  . senna-docusate  1 tablet Oral Daily  . sodium chloride  3 mL Intravenous Q12H    Continuous Infusions:  Antibiotics Given (last 72 hours)    None      Principal Problem:   CONGESTIVE HEART FAILURE, ACUTE, SYSTOLIC Active Problems:   Hypothyroidism   Essential hypertension   Lobar pneumonia (HCC)   CHF (congestive heart failure) (HCC)    Time spent: 25 min    Avnoor Koury [facility] FranciscoVANN  [facility] Pager [phone]. If 7PM-7AM, please contact night-coverage at [email or web address], password [email or web address] 11/12/2015, 11:54 AM  LOS: 1 day

## 2015-11-12 NOTE — Progress Notes (Signed)
Patient complaining of lower back and abdominal pain. MD notified; pain medication ordered and given. Bladder scan done due to low output. Bladder scan results showed >87700ml urine. Verbal order from MD to place foley catheter. 16 Fr foley placed without complications, with patient and family agreeable.

## 2015-11-12 NOTE — Progress Notes (Signed)
Pt. C/o pressure in her lower abdomen with urge to urinate. Pt. Stated she was unable to empty bladder. Bladder scan completed by RN. Greater than 400cc noted on scan. I/O cath completed. 1200cc output noted. Text page sent to on call for [facility], NP, K. Schorr. RN will continue to monitor pt. For changes in condition. Escher Harr, Cheryll DessertKaren Cherrell

## 2015-11-12 NOTE — [place] FL2 (Signed)
[place] MEDICAID [id number] LEVEL OF CARE SCREENING TOOL     IDENTIFICATION  Patient Name: [name] Birthdate: Sep 11, 1913 Sex: female Admission Date (Current Location): 11/11/2015  [facility] and [place] Number: Producer, television/film/video and Address:  [facility], [address] [place], [place] [address]      Provider Number: [id number]  Attending Physician Name and Address:  Joseph Art, DO  Relative Name and Phone Number:       Current Level of Care: Hospital Recommended Level of Care: Skilled Nursing Facility Prior Approval Number:    Date Approved/Denied:   PASRR Number:    Discharge Plan: SNF    Current Diagnoses: Patient Active Problem List   Diagnosis Date Noted  . Acute respiratory failure (HCC) 11/12/2015  . Lobar pneumonia (HCC) 11/11/2015  . CHF (congestive heart failure) (HCC) 11/11/2015  . AORTIC STENOSIS/ INSUFFICIENCY, NON-RHEUMATIC 11/03/2009  . ANEMIA OF CHRONIC DISEASE 08/16/2009  . BRADYCARDIA 08/16/2009  . CONGESTIVE HEART FAILURE, ACUTE, SYSTOLIC 08/16/2009  . FEVER UNSPECIFIED 08/11/2009  . VITAMIN B12 DEFICIENCY 04/10/2008  . ANEMIA-NOS 04/09/2008  . INSOMNIA-SLEEP DISORDER-UNSPEC 04/09/2008  . FATIGUE 04/09/2008  . Hypothyroidism 07/22/2007  . ANXIETY 07/22/2007  . GLAUCOMA NEC 07/22/2007  . Essential hypertension 07/22/2007  . OSTEOPOROSIS 07/22/2007  . SYNCOPE 07/22/2007  . INSOMNIA, HX OF 07/22/2007    Orientation ACTIVITIES/SOCIAL BLADDER RESPIRATION    Self, Time, Situation, Place    Indwelling catheter O2 (As needed) (4L [place])  BEHAVIORAL SYMPTOMS/MOOD NEUROLOGICAL BOWEL NUTRITION STATUS      Continent Diet (see DC summary)  PHYSICIAN VISITS COMMUNICATION OF NEEDS Height & Weight Skin    Verbally   81 lbs. Normal          AMBULATORY STATUS RESPIRATION    Assist extensive O2 (As needed) (4L [place])      Personal Care Assistance Level of Assistance  Bathing Bathing Assistance: Limited assistance           Functional Limitations Info  Hearing   Hearing Info: Impaired         SPECIAL CARE FACTORS FREQUENCY                      Additional Factors Info  Code Status, Psychotropic, Allergies Code Status Info: DNR Allergies Info: NKA           Current Medications (11/12/2015):  This is the current hospital active medication list Current Facility-Administered Medications  Medication Dose Route Frequency Provider Last Rate Last Dose  . 0.9 %  sodium chloride infusion  250 mL Intravenous PRN Yevonne Pax, MD      . acetaminophen (TYLENOL) tablet 650 mg  650 mg Oral Q4H PRN Yevonne Pax, MD   650 mg at 11/12/15 0939  . albuterol (PROVENTIL) (2.5 MG/3ML) 0.083% nebulizer solution 2.5 mg  2.5 mg Nebulization Q6H PRN Yevonne Pax, MD      . amLODipine (NORVASC) tablet 5 mg  5 mg Oral Daily Yevonne Pax, MD   5 mg at 11/12/15 0939  . aspirin EC tablet 325 mg  325 mg Oral Daily Yevonne Pax, MD   325 mg at 11/12/15 8119  . feeding supplement (GLUCERNA SHAKE) (GLUCERNA SHAKE) liquid 237 mL  237 mL Oral BID BM Yevonne Pax, MD   237 mL at 11/12/15 1053  . furosemide (LASIX) injection 40 mg  40 mg Intravenous Daily Yevonne Pax, MD   40 mg at 11/12/15  0940  . heparin injection 5,000 Units  5,000 Units Subcutaneous 3 times per day Yevonne PaxSaadat A Khan, MD   5,000 Units at 11/12/15 [phone]  . HYDROcodone-acetaminophen (NORCO/VICODIN) 5-325 MG per tablet 1 tablet  1 tablet Oral Q6H PRN Joseph ArtJessica U Vann, DO   1 tablet at 11/12/15 1204  . levofloxacin (LEVAQUIN) IVPB 750 mg  750 mg Intravenous Q48H Marquita PalmsCorey M Ball, RPH 100 mL/hr at 11/11/15 2040 750 mg at 11/11/15 2040  . levothyroxine (SYNTHROID, LEVOTHROID) tablet 75 mcg  75 mcg Oral QAC breakfast Yevonne PaxSaadat A Khan, MD   75 mcg at 11/12/15 [phone]  . lisinopril (PRINIVIL,ZESTRIL) tablet 20 mg  20 mg Oral BID Yevonne PaxSaadat A Khan, MD   20 mg at 11/12/15 [phone]  . Melatonin CAPS 3 mg  3 mg Oral QHS PRN Yevonne PaxSaadat A Khan, MD      . metoprolol tartrate (LOPRESSOR)  tablet 25 mg  25 mg Oral BID Yevonne PaxSaadat A Khan, MD   25 mg at 11/12/15 0941  . mirtazapine (REMERON) tablet 15 mg  15 mg Oral QHS Yevonne PaxSaadat A Khan, MD   15 mg at 11/12/15 0057  . ondansetron (ZOFRAN) injection 4 mg  4 mg Intravenous Q6H PRN Yevonne PaxSaadat A Khan, MD      . polyethylene glycol (MIRALAX / GLYCOLAX) packet 17 g  17 g Oral Daily PRN Yevonne PaxSaadat A Khan, MD      . polyvinyl alcohol (LIQUIFILM TEARS) 1.4 % ophthalmic solution 1 drop  1 drop Both Eyes BID Yevonne PaxSaadat A Khan, MD   1 drop at 11/12/15 0940  . promethazine (PHENERGAN) tablet 25 mg  25 mg Oral Q6H PRN Yevonne PaxSaadat A Khan, MD      . senna-docusate (Senokot-S) tablet 1 tablet  1 tablet Oral Daily Yevonne PaxSaadat A Khan, MD   1 tablet at 11/12/15 [phone]  . sodium chloride 0.9 % injection 3 mL  3 mL Intravenous Q12H Yevonne PaxSaadat A Khan, MD   0 mL at 11/12/15 0057  . sodium chloride 0.9 % injection 3 mL  3 mL Intravenous PRN Yevonne PaxSaadat A Khan, MD         Discharge Medications: Please see discharge summary for a list of discharge medications.  Relevant Imaging Results:  Relevant Lab Results:  Recent Labs    Additional Information    Izora RibasHoloman, Beuna Bolding M, LCSW

## 2015-11-13 ENCOUNTER — Other Ambulatory Visit (HOSPITAL_COMMUNITY): Payer: Medicare Other

## 2015-11-13 DIAGNOSIS — I5021 Acute systolic (congestive) heart failure: Secondary | ICD-10-CM

## 2015-11-13 DIAGNOSIS — I10 Essential (primary) hypertension: Secondary | ICD-10-CM

## 2015-11-13 DIAGNOSIS — J9601 Acute respiratory failure with hypoxia: Secondary | ICD-10-CM

## 2015-11-13 DIAGNOSIS — J181 Lobar pneumonia, unspecified organism: Secondary | ICD-10-CM

## 2015-11-13 DIAGNOSIS — E039 Hypothyroidism, unspecified: Secondary | ICD-10-CM

## 2015-11-13 LAB — BASIC METABOLIC PANEL
ANION GAP: 8 (ref 5–15)
BUN: 31 mg/dL — ABNORMAL HIGH (ref 6–20)
CO2: 30 mmol/L (ref 22–32)
Calcium: 8.8 mg/dL — ABNORMAL LOW (ref 8.9–10.3)
Chloride: 96 mmol/L — ABNORMAL LOW (ref 101–111)
Creatinine, Ser: 0.94 mg/dL (ref 0.44–1.00)
GFR calc Af Amer: 56 mL/min — ABNORMAL LOW (ref >60)
GFR calc non Af Amer: 48 mL/min — ABNORMAL LOW (ref >60)
GLUCOSE: 113 mg/dL — AB (ref 65–99)
Potassium: 4.4 mmol/L (ref 3.5–5.1)
Sodium: 134 mmol/L — ABNORMAL LOW (ref 135–145)

## 2015-11-13 LAB — LEGIONELLA PNEUMOPHILA SEROGP 1 UR AG: L. pneumophila Serogp 1 Ur Ag: NEGATIVE

## 2015-11-13 MED ORDER — FUROSEMIDE 20 MG PO TABS: 20.0000 mg | ORAL_TABLET | Freq: Every day | ORAL | Status: DC | PRN

## 2015-11-13 MED ORDER — LEVOFLOXACIN 500 MG PO TABS: 500.0000 mg | ORAL_TABLET | Freq: Every day | ORAL | Status: AC

## 2015-11-13 NOTE — Discharge Summary (Signed)
Physician Discharge Summary  [name] WGN:[id number] DOB: 1913-08-25 DOA: 11/11/2015  PCP: Julian Hy, MD  Admit date: 11/11/2015 Discharge date: 11/13/2015  Time spent: > 30 minutes  Recommendations for Outpatient Follow-up:  1. Follow up with Dr. Evlyn Kanner in 1 week 2. Recommend palliative consult at SNF 3. Repeat BMP in 3 days 4. Lasix as needed for fluid overload 5. Continue Levaquin for 5 additional days   Discharge Diagnoses:  Principal Problem:   CONGESTIVE HEART FAILURE, ACUTE, SYSTOLIC Active Problems:   Hypothyroidism   Essential hypertension   Lobar pneumonia (HCC)   Acute respiratory failure (HCC)  Discharge Condition: stable  Diet recommendation: as tolerated  Filed Weights   11/11/15 2218 11/12/15 0420 11/13/15 0522  Weight: 37.422 kg (82 lb 8 oz) 36.877 kg (81 lb 4.8 oz) 36.2 kg (79 lb 12.9 oz)    History of present illness:  See H&P, Labs, Consult and Test reports for all details in brief, patient is a 81 y.o. female with prior history of CHF COPD HTN Anxiety AS depression Hypothyroid presents to the ED with weakness and dyspnea  Hospital Course:   Acute hypoxic resp failure - multifactorial due to possible fluid overload vs PNA, wean O2 as tolerated. Needs 2L on discharge, wean off as tolerated. PNA - responded to IV Levaquin, convert to po on d/c for 5 additional days  Hypothyroidism - continue synthroid Possible acute on chronic CHF - last echo was 2010 and EF 60-65%. Diuresed well, hold Lasix for now given mild increase in BUN/Cr. Repeat BMP in 3 days  Severe AS - noted on echo in 2010. Hypokalemia - repleted Goals of care - Dr. Benjamine Mola spoke with daughter who does not want to pursue aggressive care - will not do echo, troponins flat  Procedures:  None    Consultations:  None   Discharge Exam: Filed Vitals:   11/12/15 1300 11/12/15 2025 11/12/15 2101 11/13/15 0522  BP: 127/78 94/73  99/54  Pulse: 98 143 85 85  Temp: 98.9 F  (37.2 C) 97.8 F (36.6 C)  97.3 F (36.3 C)  TempSrc: Oral Oral  Oral  Resp: Height:      Weight:    36.2 kg (79 lb 12.9 oz)  SpO2: 98% 99%  98%    General: NAD Cardiovascular: RRR Respiratory: decreased breath sounds bilaterally, no wheezing  Discharge Instructions Activity:  As tolerated   Get Medicines reviewed and adjusted: Please take all your medications with you for your next visit with your Primary MD  Please request your Primary MD to go over all hospital tests and procedure/radiological results at the follow up, please ask your Primary MD to get all Hospital records sent to his/her office.  If you experience worsening of your admission symptoms, develop shortness of breath, life threatening emergency, suicidal or homicidal thoughts you must seek medical attention immediately by calling 911 or calling your MD immediately if symptoms less severe.  You must read complete instructions/literature along with all the possible adverse reactions/side effects for all the Medicines you take and that have been prescribed to you. Take any new Medicines after you have completely understood and accpet all the possible adverse reactions/side effects.   Do not drive when taking Pain medications.   Do not take more than prescribed Pain, Sleep and Anxiety Medications  Special Instructions: If you have smoked or chewed Tobacco in the last 2 yrs please stop smoking, stop any regular Alcohol and or any  Recreational drug use.  Wear Seat belts while driving.  Please note  You were cared for by a hospitalist during your hospital stay. Once you are discharged, your primary care physician will handle any further medical issues. Please note that NO REFILLS for any discharge medications will be authorized once you are discharged, as it is imperative that you return to your primary care physician (or establish a relationship with a primary care physician if you do not have one) for  your aftercare needs so that they can reassess your need for medications and monitor your lab values.    Medication List    TAKE these medications        albuterol (2.5 MG/3ML) 0.083% nebulizer solution  Commonly known as:  PROVENTIL  Take 2.5 mg by nebulization every 6 (six) hours as needed for wheezing or shortness of breath.     amLODipine 5 MG tablet  Commonly known as:  NORVASC  Take 5 mg by mouth daily.     feeding supplement (GLUCERNA SHAKE) Liqd  Take 237 mLs by mouth 2 (two) times daily between meals.     FRESHKOTE 2.7-2 % Soln  Generic drug:  Polyvinyl Alcohol-Povidone  Apply 1 drop to eye 2 (two) times daily.     furosemide 20 MG tablet  Commonly known as:  LASIX  Take 1 tablet (20 mg total) by mouth daily as needed for fluid.     levothyroxine 75 MCG tablet  Commonly known as:  SYNTHROID, LEVOTHROID  Take 75 mcg by mouth daily before breakfast.     lisinopril 20 MG tablet  Commonly known as:  PRINIVIL,ZESTRIL  Take 20 mg by mouth 2 (two) times daily.     Melatonin 3 MG Caps  Take 3 mg by mouth at bedtime as needed (sleep).     metoprolol tartrate 25 MG tablet  Commonly known as:  LOPRESSOR  Take 25 mg by mouth 2 (two) times daily.     mirtazapine 15 MG tablet  Commonly known as:  REMERON  Take 15 mg by mouth at bedtime.     polyethylene glycol packet  Commonly known as:  MIRALAX / GLYCOLAX  Take 17 g by mouth daily as needed for mild constipation or moderate constipation.     PRESERVISION AREDS 2 PO  Take 1 capsule by mouth 2 (two) times daily.     promethazine 25 MG tablet  Commonly known as:  PHENERGAN  Take 25 mg by mouth every 6 (six) hours as needed for nausea or vomiting.     sennosides-docusate sodium 8.6-50 MG tablet  Commonly known as:  SENOKOT-S  Take 1 tablet by mouth daily.           Follow-up Information    Follow up with Julian Hy, MD. Schedule an appointment as soon as possible for a visit in 1 week.   Specialty:   Endocrinology   Contact information:   [address] [place] [address] [phone]       The results of significant diagnostics from this hospitalization (including imaging, microbiology, ancillary and laboratory) are listed below for reference.    Significant Diagnostic Studies: Dg Chest Port 1 View  11/11/2015  CLINICAL DATA:  Cough, shortness of Breath EXAM: PORTABLE CHEST 1 VIEW COMPARISON:  05/25/2011 FINDINGS: Study is limited by patient's rotation. Dual lead cardiac pacemaker is unchanged in position. Hazy bilateral airspace disease left greater than right suspicious for asymmetric edema or pneumonia. IMPRESSION: Hazy bilateral airspace disease left greater than  right suspicious for asymmetric pulmonary edema or pneumonia. Electronically Signed   By: Natasha MeadLiviu  Pop M.D.   On: 11/11/2015 17:01    Microbiology: Recent Results (from the past 240 hour(s))  MRSA PCR Screening     Status: None   Collection Time: 11/11/15  8:21 PM  Result Value Ref Range Status   MRSA by PCR NEGATIVE NEGATIVE Final    Comment:        The GeneXpert MRSA Assay (FDA approved for NASAL specimens only), is one component of a comprehensive MRSA colonization surveillance program. It is not intended to diagnose MRSA infection nor to guide or monitor treatment for MRSA infections.      Labs: Basic Metabolic Panel:  Recent Labs Lab 11/11/15 1614 11/12/15 0348 11/13/15 0409  NA 136 137 134*  K 4.1 3.3* 4.4  CL 105 100* 96*  CO2 24 30 30   GLUCOSE 234* 123* 113*  BUN 21* 15 31*  CREATININE 0.57 0.54 0.94  CALCIUM 9.2 8.9 8.8*   CBC:  Recent Labs Lab 11/11/15 1614  WBC 18.2*  NEUTROABS 16.2*  HGB 12.4  HCT 38.1  MCV 90.9  PLT 153   Cardiac Enzymes:  Recent Labs Lab 11/11/15 2212 11/12/15 0348 11/12/15 1015  TROPONINI 0.09* 0.09* 0.07*   BNP: BNP (last 3 results)  Recent Labs  11/12/15 1115  BNP 1451.0*      Signed:  Pamella PertGHERGHE, COSTIN  [facility] 11/13/2015, 9:19 AM

## 2015-11-13 NOTE — Discharge Instructions (Signed)
Follow with [name],Karen ALAN, MD in 5-7 days  Please get a complete blood count and chemistry panel checked by your Primary MD at your next visit, and again as instructed by your Primary MD. Please get your medications reviewed and adjusted by your Primary MD.  Please request your Primary MD to go over all Hospital Tests and Procedure/Radiological results at the follow up, please get all Hospital records sent to your Prim MD by signing hospital release before you go home.  If you had Pneumonia of Lung problems at the Hospital: Please get a 2 view Chest X ray done in 6-8 weeks after hospital discharge or sooner if instructed by your Primary MD.  If you have Congestive Heart Failure: Please call your Cardiologist or Primary MD anytime you have any of the following symptoms:  1) 3 pound weight gain in 24 hours or 5 pounds in 1 week  2) shortness of breath, with or without a dry hacking cough  3) swelling in the hands, feet or stomach  4) if you have to sleep on extra pillows at night in order to breathe  Follow cardiac low salt diet and 1.5 lit/day fluid restriction.  If you have diabetes Accuchecks 4 times/day, Once in AM empty stomach and then before each meal. Log in all results and show them to your primary doctor at your next visit. If any glucose reading is under 80 or above 300 call your primary MD immediately.  If you have Seizure/Convulsions/Epilepsy: Please do not drive, operate heavy machinery, participate in activities at heights or participate in high speed sports until you have seen by Primary MD or a Neurologist and advised to do so again.  If you had Gastrointestinal Bleeding: Please ask your Primary MD to check a complete blood count within one week of discharge or at your next visit. Your endoscopic/colonoscopic biopsies that are pending at the time of discharge, will also need to followed by your Primary MD.  Get Medicines reviewed and adjusted. Please take all your  medications with you for your next visit with your Primary MD  Please request your Primary MD to go over all hospital tests and procedure/radiological results at the follow up, please ask your Primary MD to get all Hospital records sent to his/her office.  If you experience worsening of your admission symptoms, develop shortness of breath, life threatening emergency, suicidal or homicidal thoughts you must seek medical attention immediately by calling 911 or calling your MD immediately  if symptoms less severe.  You must read complete instructions/literature along with all the possible adverse reactions/side effects for all the Medicines you take and that have been prescribed to you. Take any new Medicines after you have completely understood and accpet all the possible adverse reactions/side effects.   Do not drive or operate heavy machinery when taking Pain medications.   Do not take more than prescribed Pain, Sleep and Anxiety Medications  Special Instructions: If you have smoked or chewed Tobacco  in the last 2 yrs please stop smoking, stop any regular Alcohol  and or any Recreational drug use.  Wear Seat belts while driving.  Please note You were cared for by a hospitalist during your hospital stay. If you have any questions about your discharge medications or the care you received while you were in the hospital after you are discharged, you can call the unit and asked to speak with the hospitalist on call if the hospitalist that took care of you is not available. Once  you are discharged, your primary care physician will handle any further medical issues. Please note that NO REFILLS for any discharge medications will be authorized once you are discharged, as it is imperative that you return to your primary care physician (or establish a relationship with a primary care physician if you do not have one) for your aftercare needs so that they can reassess your need for medications and monitor your  lab values.  You can reach the hospitalist office at phone [phone] or fax [phone]   If you do not have a primary care physician, you can call [phone] for a physician referral.  Activity: As tolerated with Full fall precautions use walker/cane & assistance as needed  Diet: as tolerated  Disposition SNF

## 2015-11-13 NOTE — Progress Notes (Signed)
Patient will discharge to [facility] Anticipated discharge date:11/13/15 Family notified: pt dtr- Vivia BudgeLinda Transportation by Sharin MonsPTAR- scheduled for 1:30pm Report #: [phone]  CSW signing off.  Merlyn LotJenna Holoman, LCSWA Clinical Social Worker [phone]

## 2015-11-20 ENCOUNTER — Encounter (INDEPENDENT_AMBULATORY_CARE_PROVIDER_SITE_OTHER): Payer: Self-pay | Admitting: Ophthalmology

## 2016-01-11 ENCOUNTER — Encounter (HOSPITAL_COMMUNITY): Payer: Self-pay

## 2016-01-11 ENCOUNTER — Emergency Department (HOSPITAL_COMMUNITY): Payer: Medicare Other

## 2016-01-11 ENCOUNTER — Inpatient Hospital Stay (HOSPITAL_COMMUNITY)
Admission: EM | Admit: 2016-01-11 | Discharge: 2016-01-14 | DRG: 871 | Disposition: E | Payer: Medicare Other | Attending: Internal Medicine | Admitting: Internal Medicine

## 2016-01-11 DIAGNOSIS — Z87891 Personal history of nicotine dependence: Secondary | ICD-10-CM | POA: Diagnosis not present

## 2016-01-11 DIAGNOSIS — Z79899 Other long term (current) drug therapy: Secondary | ICD-10-CM

## 2016-01-11 DIAGNOSIS — Z950 Presence of cardiac pacemaker: Secondary | ICD-10-CM | POA: Diagnosis not present

## 2016-01-11 DIAGNOSIS — A419 Sepsis, unspecified organism: Principal | ICD-10-CM | POA: Diagnosis present

## 2016-01-11 DIAGNOSIS — R0600 Dyspnea, unspecified: Secondary | ICD-10-CM | POA: Diagnosis present

## 2016-01-11 DIAGNOSIS — Z993 Dependence on wheelchair: Secondary | ICD-10-CM | POA: Diagnosis not present

## 2016-01-11 DIAGNOSIS — E039 Hypothyroidism, unspecified: Secondary | ICD-10-CM | POA: Diagnosis not present

## 2016-01-11 DIAGNOSIS — I5032 Chronic diastolic (congestive) heart failure: Secondary | ICD-10-CM | POA: Diagnosis not present

## 2016-01-11 DIAGNOSIS — R0902 Hypoxemia: Secondary | ICD-10-CM

## 2016-01-11 DIAGNOSIS — Z7401 Bed confinement status: Secondary | ICD-10-CM

## 2016-01-11 DIAGNOSIS — I5033 Acute on chronic diastolic (congestive) heart failure: Secondary | ICD-10-CM | POA: Diagnosis not present

## 2016-01-11 DIAGNOSIS — Z66 Do not resuscitate: Secondary | ICD-10-CM | POA: Diagnosis present

## 2016-01-11 DIAGNOSIS — F329 Major depressive disorder, single episode, unspecified: Secondary | ICD-10-CM | POA: Diagnosis present

## 2016-01-11 DIAGNOSIS — J9601 Acute respiratory failure with hypoxia: Secondary | ICD-10-CM | POA: Diagnosis present

## 2016-01-11 DIAGNOSIS — Z515 Encounter for palliative care: Secondary | ICD-10-CM | POA: Diagnosis present

## 2016-01-11 DIAGNOSIS — R627 Adult failure to thrive: Secondary | ICD-10-CM | POA: Diagnosis present

## 2016-01-11 DIAGNOSIS — M810 Age-related osteoporosis without current pathological fracture: Secondary | ICD-10-CM | POA: Diagnosis present

## 2016-01-11 DIAGNOSIS — E876 Hypokalemia: Secondary | ICD-10-CM | POA: Diagnosis present

## 2016-01-11 DIAGNOSIS — J189 Pneumonia, unspecified organism: Secondary | ICD-10-CM | POA: Diagnosis present

## 2016-01-11 DIAGNOSIS — G8929 Other chronic pain: Secondary | ICD-10-CM | POA: Diagnosis present

## 2016-01-11 DIAGNOSIS — I248 Other forms of acute ischemic heart disease: Secondary | ICD-10-CM | POA: Diagnosis present

## 2016-01-11 DIAGNOSIS — E785 Hyperlipidemia, unspecified: Secondary | ICD-10-CM | POA: Diagnosis present

## 2016-01-11 DIAGNOSIS — M545 Low back pain: Secondary | ICD-10-CM | POA: Diagnosis present

## 2016-01-11 DIAGNOSIS — I214 Non-ST elevation (NSTEMI) myocardial infarction: Secondary | ICD-10-CM | POA: Diagnosis not present

## 2016-01-11 DIAGNOSIS — F411 Generalized anxiety disorder: Secondary | ICD-10-CM | POA: Diagnosis present

## 2016-01-11 DIAGNOSIS — I350 Nonrheumatic aortic (valve) stenosis: Secondary | ICD-10-CM | POA: Diagnosis present

## 2016-01-11 DIAGNOSIS — R0603 Acute respiratory distress: Secondary | ICD-10-CM

## 2016-01-11 DIAGNOSIS — Z96649 Presence of unspecified artificial hip joint: Secondary | ICD-10-CM | POA: Diagnosis present

## 2016-01-11 DIAGNOSIS — J14 Pneumonia due to Hemophilus influenzae: Secondary | ICD-10-CM | POA: Diagnosis present

## 2016-01-11 DIAGNOSIS — Y95 Nosocomial condition: Secondary | ICD-10-CM | POA: Diagnosis present

## 2016-01-11 DIAGNOSIS — I10 Essential (primary) hypertension: Secondary | ICD-10-CM | POA: Diagnosis present

## 2016-01-11 DIAGNOSIS — R54 Age-related physical debility: Secondary | ICD-10-CM | POA: Diagnosis present

## 2016-01-11 DIAGNOSIS — F32A Depression, unspecified: Secondary | ICD-10-CM | POA: Diagnosis present

## 2016-01-11 LAB — I-STAT ARTERIAL BLOOD GAS, ED
Acid-base deficit: 1 mmol/L (ref 0.0–2.0)
BICARBONATE: 26.6 meq/L — AB (ref 20.0–24.0)
O2 Saturation: 96 %
PO2 ART: 89 mmHg (ref 80.0–100.0)
TCO2: 28 mmol/L (ref 0–100)
pCO2 arterial: 54.2 mmHg — ABNORMAL HIGH (ref 35.0–45.0)
pH, Arterial: 7.3 — ABNORMAL LOW (ref 7.350–7.450)

## 2016-01-11 LAB — COMPREHENSIVE METABOLIC PANEL
ALBUMIN: 3.5 g/dL (ref 3.5–5.0)
ALK PHOS: 81 U/L (ref 38–126)
ALT: 27 U/L (ref 14–54)
AST: 40 U/L (ref 15–41)
Anion gap: 13 (ref 5–15)
BILIRUBIN TOTAL: 0.2 mg/dL — AB (ref 0.3–1.2)
BUN: 24 mg/dL — AB (ref 6–20)
CO2: 25 mmol/L (ref 22–32)
CREATININE: 0.87 mg/dL (ref 0.44–1.00)
Calcium: 9.4 mg/dL (ref 8.9–10.3)
Chloride: 98 mmol/L — ABNORMAL LOW (ref 101–111)
GFR calc Af Amer: >60 mL/min (ref >60)
GFR, EST NON AFRICAN AMERICAN: 52 mL/min — AB (ref >60)
GLUCOSE: 221 mg/dL — AB (ref 65–99)
POTASSIUM: 3.7 mmol/L (ref 3.5–5.1)
Sodium: 136 mmol/L (ref 135–145)
TOTAL PROTEIN: 7 g/dL (ref 6.5–8.1)

## 2016-01-11 LAB — INFLUENZA PANEL BY PCR (TYPE A & B)
H1N1 flu by pcr: NOT DETECTED
Influenza A By PCR: NEGATIVE
Influenza B By PCR: NEGATIVE

## 2016-01-11 LAB — I-STAT CHEM 8, ED
BUN: 32 mg/dL — AB (ref 6–20)
CREATININE: 0.8 mg/dL (ref 0.44–1.00)
Calcium, Ion: 1.22 mmol/L (ref 1.13–1.30)
Chloride: 94 mmol/L — ABNORMAL LOW (ref 101–111)
GLUCOSE: 219 mg/dL — AB (ref 65–99)
HEMATOCRIT: 46 % (ref 36.0–46.0)
Hemoglobin: 15.6 g/dL — ABNORMAL HIGH (ref 12.0–15.0)
POTASSIUM: 3.4 mmol/L — AB (ref 3.5–5.1)
Sodium: 137 mmol/L (ref 135–145)
TCO2: 28 mmol/L (ref 0–100)

## 2016-01-11 LAB — CBC WITH DIFFERENTIAL/PLATELET
BASOS ABS: 0 10*3/uL (ref 0.0–0.1)
Basophils Relative: 0 %
EOS PCT: 0 %
Eosinophils Absolute: 0 10*3/uL (ref 0.0–0.7)
HEMATOCRIT: 41.2 % (ref 36.0–46.0)
HEMOGLOBIN: 13.5 g/dL (ref 12.0–15.0)
LYMPHS ABS: 0.4 10*3/uL — AB (ref 0.7–4.0)
LYMPHS PCT: 2 %
MCH: 29.2 pg (ref 26.0–34.0)
MCHC: 32.8 g/dL (ref 30.0–36.0)
MCV: 89 fL (ref 78.0–100.0)
MONOS PCT: 3 %
Monocytes Absolute: 0.6 10*3/uL (ref 0.1–1.0)
NEUTROS PCT: 95 %
Neutro Abs: 19.3 10*3/uL — ABNORMAL HIGH (ref 1.7–7.7)
Platelets: 148 10*3/uL — ABNORMAL LOW (ref 150–400)
RBC: 4.63 MIL/uL (ref 3.87–5.11)
RDW: 14.4 % (ref 11.5–15.5)
WBC: 20.3 10*3/uL — AB (ref 4.0–10.5)

## 2016-01-11 LAB — I-STAT CG4 LACTIC ACID, ED: Lactic Acid, Venous: 5.15 mmol/L (ref 0.5–2.0)

## 2016-01-11 LAB — BRAIN NATRIURETIC PEPTIDE: B Natriuretic Peptide: 3427.3 pg/mL — ABNORMAL HIGH (ref 0.0–100.0)

## 2016-01-11 LAB — I-STAT TROPONIN, ED: Troponin i, poc: 0.11 ng/mL (ref 0.00–0.08)

## 2016-01-11 LAB — LIPASE, BLOOD: LIPASE: 22 U/L (ref 11–51)

## 2016-01-11 LAB — MAGNESIUM: Magnesium: 1.9 mg/dL (ref 1.7–2.4)

## 2016-01-11 MED ORDER — SODIUM CHLORIDE 0.9 % IV SOLN
INTRAVENOUS | Status: DC
  Administered 2016-01-11: 04:00:00 via INTRAVENOUS

## 2016-01-11 MED ORDER — SODIUM CHLORIDE 0.9 % IV BOLUS (SEPSIS)
1000.0000 mL | Freq: Once | INTRAVENOUS | Status: AC
  Administered 2016-01-11: 1000 mL via INTRAVENOUS

## 2016-01-11 MED ORDER — LORAZEPAM 2 MG/ML IJ SOLN: 0.5000 mg | INTRAMUSCULAR | Status: DC | PRN

## 2016-01-11 MED ORDER — LISINOPRIL 20 MG PO TABS: 20.0000 mg | ORAL_TABLET | Freq: Two times a day (BID) | ORAL | Status: DC

## 2016-01-11 MED ORDER — IPRATROPIUM-ALBUTEROL 0.5-2.5 (3) MG/3ML IN SOLN: 3.0000 mL | Freq: Four times a day (QID) | RESPIRATORY_TRACT | Status: DC

## 2016-01-11 MED ORDER — FUROSEMIDE 10 MG/ML IJ SOLN
60.0000 mg | Freq: Once | INTRAMUSCULAR | Status: AC
  Administered 2016-01-11: 60 mg via INTRAVENOUS
  Filled 2016-01-11: qty 6

## 2016-01-11 MED ORDER — OCUVITE-LUTEIN PO CAPS
1.0000 | ORAL_CAPSULE | Freq: Two times a day (BID) | ORAL | Status: DC
  Filled 2016-01-11 (×2): qty 1

## 2016-01-11 MED ORDER — POLYETHYLENE GLYCOL 3350 17 G PO PACK: 17.0000 g | PACK | Freq: Every day | ORAL | Status: DC | PRN

## 2016-01-11 MED ORDER — HYDRALAZINE HCL 20 MG/ML IJ SOLN: 5.0000 mg | INTRAMUSCULAR | Status: DC | PRN

## 2016-01-11 MED ORDER — MORPHINE SULFATE (PF) 2 MG/ML IV SOLN
1.0000 mg | INTRAVENOUS | Status: DC | PRN
  Administered 2016-01-11: 1 mg via INTRAVENOUS
  Filled 2016-01-11: qty 1

## 2016-01-11 MED ORDER — LEVOTHYROXINE SODIUM 75 MCG PO TABS: 75.0000 ug | ORAL_TABLET | Freq: Every day | ORAL | Status: DC

## 2016-01-11 MED ORDER — LEVALBUTEROL HCL 1.25 MG/0.5ML IN NEBU
1.2500 mg | INHALATION_SOLUTION | Freq: Four times a day (QID) | RESPIRATORY_TRACT | Status: DC
  Filled 2016-01-11 (×5): qty 0.5

## 2016-01-11 MED ORDER — POLYVINYL ALCOHOL 1.4 % OP SOLN
1.0000 [drp] | Freq: Two times a day (BID) | OPHTHALMIC | Status: DC
  Filled 2016-01-11: qty 15

## 2016-01-11 MED ORDER — CEFEPIME HCL 1 G IJ SOLR: 1.0000 g | Freq: Three times a day (TID) | INTRAMUSCULAR | Status: DC

## 2016-01-11 MED ORDER — ATORVASTATIN CALCIUM 10 MG PO TABS: 10.0000 mg | ORAL_TABLET | Freq: Every day | ORAL | Status: DC

## 2016-01-11 MED ORDER — POTASSIUM CHLORIDE 20 MEQ/15ML (10%) PO SOLN: 20.0000 meq | Freq: Once | ORAL | Status: DC

## 2016-01-11 MED ORDER — MORPHINE SULFATE (PF) 2 MG/ML IV SOLN
2.0000 mg | INTRAVENOUS | Status: DC | PRN
  Administered 2016-01-11: 2 mg via INTRAVENOUS
  Filled 2016-01-11: qty 1

## 2016-01-11 MED ORDER — ASPIRIN 300 MG RE SUPP: 300.0000 mg | Freq: Every day | RECTAL | Status: DC

## 2016-01-11 MED ORDER — ASPIRIN EC 81 MG PO TBEC: 81.0000 mg | DELAYED_RELEASE_TABLET | Freq: Every day | ORAL | Status: DC

## 2016-01-11 MED ORDER — ALBUTEROL SULFATE (2.5 MG/3ML) 0.083% IN NEBU: 2.5000 mg | INHALATION_SOLUTION | RESPIRATORY_TRACT | Status: DC | PRN

## 2016-01-11 MED ORDER — AMLODIPINE BESYLATE 5 MG PO TABS: 5.0000 mg | ORAL_TABLET | Freq: Every day | ORAL | Status: DC

## 2016-01-11 MED ORDER — DM-GUAIFENESIN ER 30-600 MG PO TB12: 1.0000 | ORAL_TABLET | Freq: Two times a day (BID) | ORAL | Status: DC | PRN

## 2016-01-11 MED ORDER — OXYCODONE-ACETAMINOPHEN 5-325 MG PO TABS: 1.0000 | ORAL_TABLET | ORAL | Status: DC | PRN

## 2016-01-11 MED ORDER — DEXTROSE 5 % IV SOLN
2.0000 g | Freq: Once | INTRAVENOUS | Status: AC
  Administered 2016-01-11: 2 g via INTRAVENOUS
  Filled 2016-01-11: qty 2

## 2016-01-11 MED ORDER — VANCOMYCIN HCL IN DEXTROSE 1-5 GM/200ML-% IV SOLN
1000.0000 mg | Freq: Once | INTRAVENOUS | Status: AC
  Administered 2016-01-11: 1000 mg via INTRAVENOUS
  Filled 2016-01-11: qty 200

## 2016-01-11 MED ORDER — NITROGLYCERIN 0.4 MG SL SUBL: 0.4000 mg | SUBLINGUAL_TABLET | SUBLINGUAL | Status: DC | PRN

## 2016-01-11 MED ORDER — MORPHINE SULFATE (PF) 2 MG/ML IV SOLN: 2.0000 mg | INTRAVENOUS | Status: DC | PRN

## 2016-01-11 MED ORDER — SENNOSIDES-DOCUSATE SODIUM 8.6-50 MG PO TABS: 1.0000 | ORAL_TABLET | Freq: Every day | ORAL | Status: DC

## 2016-01-11 MED ORDER — METOPROLOL TARTRATE 25 MG PO TABS: 25.0000 mg | ORAL_TABLET | Freq: Two times a day (BID) | ORAL | Status: DC

## 2016-01-11 MED ORDER — VANCOMYCIN HCL 500 MG IV SOLR: 500.0000 mg | INTRAVENOUS | Status: DC

## 2016-01-11 MED ORDER — MELATONIN 3 MG PO CAPS: 3.0000 mg | ORAL_CAPSULE | Freq: Every day | ORAL | Status: DC

## 2016-01-11 MED ORDER — PROMETHAZINE HCL 25 MG PO TABS: 25.0000 mg | ORAL_TABLET | Freq: Four times a day (QID) | ORAL | Status: DC | PRN

## 2016-01-11 MED ORDER — ENOXAPARIN SODIUM 30 MG/0.3ML ~~LOC~~ SOLN
15.0000 mg | SUBCUTANEOUS | Status: DC
  Filled 2016-01-11: qty 0.15

## 2016-01-11 MED ORDER — DEXTROSE 5 % IV SOLN: 1.0000 g | INTRAVENOUS | Status: DC

## 2016-01-11 MED ORDER — SODIUM CHLORIDE 0.9 % IV BOLUS (SEPSIS)
500.0000 mL | INTRAVENOUS | Status: AC
  Administered 2016-01-11: 500 mL via INTRAVENOUS

## 2016-01-13 LAB — CULTURE, RESPIRATORY W GRAM STAIN

## 2016-01-13 LAB — CULTURE, RESPIRATORY

## 2016-01-14 NOTE — H&P (Addendum)
[facility] History and Physical  [name] ZOX:[id number] DOB: 25-Aug-1913 DOA: 02-06-16  Referring physician: ED physician PCP: Julian Hy, MD  Specialists:   Chief Complaint: Shortness of breath  HPI: [name] is a 82 y.o. female with PMH of pacemaker placement, hypertension, hyperlipidemia, hypothyroidism, diastolic congestive heart failure with EF 60-65%, aortic stenosis, chronic back pain, depression, anxiety, who presents with shortness of breath.  Per patient's daughter, patient is from a nursing home. She was noticed to have respiratory distress at 7 PM. She does not seem to have cough, chest pain, fever or chills. She reports having chronic back pain. Per EMS, pt was found lethargic with O2 desaturation to 70s on RA. Pt does not have abdominal pain, diarrhea, symptoms of UTI or unilateral weakness.  In ED, patient was found to have WBC 20.3, troponin 0.11, lactate level 5.15, BNP 3427, temperature normal, tachycardia, tachypnea, oxygen and desaturated to 74%, room air, electrolytes and renal function okay, and pending urinalysis. Chest x-ray showed possible bilateral pneumonia. Patient is admitted to inpatient for further eval and treatment.  EKG: Independently reviewed. QTC 510, LAD, PVC, left bundle blockage which seems to be old issue.  Where does patient live?  [facility]F Can patient participate in ADLs?  None   Review of Systems:   General: no fevers, chills, has fatigue HEENT: no blurry vision, hearing changes or sore throat Pulm: has dyspnea, no coughing, wheezing CV: no chest pain, palpitations Abd: no nausea, vomiting, abdominal pain, diarrhea, constipation GU: no dysuria, burning on urination, increased urinary frequency, hematuria  Ext: no leg edema Neuro: no unilateral weakness, numbness, or tingling, no vision change or hearing loss Skin: no rash MSK: No muscle spasm, no deformity, no limitation of range of movement in spin Heme: No easy  bruising.  Travel history: No recent long distant travel.  Allergy: No Known Allergies  Past Medical History  Diagnosis Date  . Thyroid disease   . Hypertension   . CHF (congestive heart failure) (HCC)   . Aortic stenosis   . Low back pain   . Hyperlipidemia   . Anxiety   . Depression   . Osteoporosis   . Anemia   . Presence of permanent cardiac pacemaker     Past Surgical History  Procedure Laterality Date  . Pacemaker insertion    . Hip replace    . Insert / replace / remove pacemaker    . Total hip arthroplasty      Social History:  reports that she has quit smoking. She has never used smokeless tobacco. She reports that she does not drink alcohol or use illicit drugs.  Family History: Reviewed, but the patient does not remember any family medical history.  Family History  Problem Relation Age of Onset  . Family history unknown: Yes     Prior to Admission medications   Medication Sig Start Date End Date Taking? Authorizing Provider  amLODipine (NORVASC) 5 MG tablet Take 5 mg by mouth daily.     Yes Historical Provider, MD  furosemide (LASIX) 40 MG tablet Take 40 mg by mouth daily.   Yes Historical Provider, MD  ipratropium-albuterol (DUONEB) 0.5-2.5 (3) MG/3ML SOLN Take 3 mLs by nebulization every 6 (six) hours as needed (for wheezing).   Yes Historical Provider, MD  levothyroxine (SYNTHROID, LEVOTHROID) 75 MCG tablet Take 75 mcg by mouth daily before breakfast.   Yes Historical Provider, MD  lisinopril (PRINIVIL,ZESTRIL) 20 MG tablet Take 20 mg by mouth 2 (two) times  daily.     Yes Historical Provider, MD  Melatonin 3 MG CAPS Take 3 mg by mouth at bedtime.    Yes Historical Provider, MD  metoprolol tartrate (LOPRESSOR) 25 MG tablet Take 25 mg by mouth 2 (two) times daily.     Yes Historical Provider, MD  Multiple Vitamins-Minerals (PRESERVISION AREDS 2 PO) Take 2 tablets by mouth 2 (two) times daily.    Yes Historical Provider, MD  Polyvinyl Alcohol-Povidone  (FRESHKOTE) 2.7-2 % SOLN Apply 1 drop to eye 2 (two) times daily.   Yes Historical Provider, MD  potassium chloride SA (K-DUR,KLOR-CON) 20 MEQ tablet Take 40 mEq by mouth daily. Take for 3 days starting 01/10/16 01/10/16 01/13/16 Yes Historical Provider, MD  sennosides-docusate sodium (SENOKOT-S) 8.6-50 MG tablet Take 1 tablet by mouth daily.     Yes Historical Provider, MD  UNABLE TO FIND Take 120 mLs by mouth 3 (three) times daily. Med Pass   Yes Historical Provider, MD  levofloxacin (LEVAQUIN) 500 MG tablet Take 1 tablet (500 mg total) by mouth daily. Patient not taking: Reported on 01-28-16 11/13/15   Leatha Gilding, MD  polyethylene glycol (MIRALAX / Ethelene Hal) packet Take 17 g by mouth daily as needed for mild constipation or moderate constipation.    Historical Provider, MD  promethazine (PHENERGAN) 25 MG tablet Take 25 mg by mouth every 6 (six) hours as needed for nausea or vomiting.  09/09/15   Historical Provider, MD    Physical Exam: Filed Vitals:   01-28-2016 0130 2016-01-28 0145 28-Jan-2016 0200 28-Jan-2016 0253  BP: 135/96 122/71    Pulse: 101 95 95   Temp:    98.8 F (37.1 C)  TempSrc:    Rectal  Resp: 34 35 27   Height:      Weight:      SpO2: 92% 95% 98%    General: Not in acute distress HEENT:       Eyes: PERRL, EOMI, no scleral icterus.       ENT: No discharge from the ears and nose, no pharynx injection, no tonsillar enlargement.        Neck: No JVD, no bruit, no mass felt. Heme: No neck lymph node enlargement. Cardiac: S1/S2, RRR, tachycardia, 2/6 systolic murmurs, No gallops or rubs. Pulm:  No rales, wheezing, rhonchi or rubs. Abd: Soft, nondistended, nontender, no rebound pain, no organomegaly, BS present. Ext: No pitting leg edema bilaterally. 2+DP/PT pulse bilaterally. Musculoskeletal: No joint deformities, No joint redness or warmth, no limitation of ROM in spin. Skin: No rashes.  Neuro: Mildly confused,  oriented to person, but not to time and place, cranial nerves  II-XII grossly intact, moves all extremities. Psych: Patient is not psychotic, no suicidal or hemocidal ideation.  Labs on Admission:  Basic Metabolic Panel:  Recent Labs Lab 28-Jan-2016 0206 01-28-2016 0220  NA 136 137  K 3.7 3.4*  CL 98* 94*  CO2 25  --   GLUCOSE 221* 219*  BUN 24* 32*  CREATININE 0.87 0.80  CALCIUM 9.4  --   MG 1.9  --    Liver Function Tests:  Recent Labs Lab January 28, 2016 0206  AST 40  ALT 27  ALKPHOS 81  BILITOT 0.2*  PROT 7.0  ALBUMIN 3.5    Recent Labs Lab January 28, 2016 0206  LIPASE 22   No results for input(s): AMMONIA in the last 168 hours. CBC:  Recent Labs Lab Jan 28, 2016 0206 2016-01-28 0220  WBC 20.3*  --   NEUTROABS 19.3*  --  HGB 13.5 15.6*  HCT 41.2 46.0  MCV 89.0  --   PLT 148*  --    Cardiac Enzymes: No results for input(s): CKTOTAL, CKMB, CKMBINDEX, TROPONINI in the last 168 hours.  BNP (last 3 results)  Recent Labs  11/12/15 1115 February 10, 2016 0206  BNP 1451.0* 3427.3*    ProBNP (last 3 results) No results for input(s): PROBNP in the last 8760 hours.  CBG: No results for input(s): GLUCAP in the last 168 hours.  Radiological Exams on Admission: Dg Chest Port 1 View  February 10, 2016  CLINICAL DATA:  Acute onset of shortness of breath. Pale in appearance, with lethargy. Initial encounter. EXAM: PORTABLE CHEST 1 VIEW COMPARISON:  Chest radiograph performed 11/11/2015 FINDINGS: The lungs are well-aerated. Small bilateral pleural effusions are noted. Vascular congestion is seen. Bibasilar airspace opacities may reflect mild interstitial edema or possibly pneumonia. There is no evidence of pneumothorax. The cardiomediastinal silhouette is mildly enlarged. A pacemaker is noted overlying the left chest wall, with leads ending overlying the right atrium and right ventricle. No acute osseous abnormalities are seen. IMPRESSION: Small bilateral pleural effusions noted. Vascular congestion and mild cardiomegaly seen. Bibasilar airspace opacities may  reflect mild interstitial edema or possibly pneumonia. Electronically Signed   By: Roanna Raider M.D.   On: 02/10/2016 02:25    Assessment/Plan Principal Problem:   Acute respiratory failure with hypoxia (HCC) Active Problems:   Hypothyroidism   Anxiety state   Chronic diastolic (congestive) heart failure (HCC)   Depression   NSTEMI (non-ST elevated myocardial infarction) (HCC)   Sepsis (HCC)   HCAP (healthcare-associated pneumonia)  Sepsis due to HCAP: Patient has leukocytosis and shortness of breath plus x-ray findings, consistent with HCAP. She is septic with elevated lactate 5.15, leukocytosis, tachycardia and tachypnea. She is hemodynamically stable on admission.  - Will admit to SDU - IV Vancomycin and cefepime - Mucinex for cough  - Xopenex Neb prn for SOB - Urine legionella and S. pneumococcal antigen - Follow up blood culture x2, sputum culture, plus Flu pcr - will get Procalcitonin and trend lactic acid level per sepsis protocol - IVF: 1.5L of NS bolus in ED, followed by 50 mL per hour of NS (patient's body weight is 35 kg)  Elevated trop/NSTMEI: trop 0.11 on admission. No chest pain. Likely due to demanding ischemic secondary to sepsis. Per her daughter, no aggressive things should be done, such as cardiac cath. Will treat patient medically. - will admit to Tele bed  - cycle CE q6 x3 and repeat her EKG in the am  - Nitroglycerin, Morphine, and aspirin, lipitor and metoprolol - 2d echo  Chronic diastolic congestive heart failure: 2-D echo on 07/17/09 showed EF 68-6 5%. Patient is on Lasix to 40 mg daily. BNP is elevated at 3427. She does not have leg edema. Her body weight is stable, 79 LBs on 11/13/15--> 79 pounds today. -Hold lasix due to sepsis and the need of IVF -continue metoprolol -Started aspirin due to elevated troponin  Hypothyroidism: Last TSH was 0.582 on 11/11/15 -Continue home Synthroid  HTN:  -Hold amlodipine and lisinopril since patient is at risk of  developing hypotension due to sepsis. -Continue metoprolol -IV hydralazine when necessary  Hypokalemia: Potassium is 3.4 -Repleted.  Goal of care: I spent lengthy time with her daughter in discussing the goal of care. Patient is DO NOT RESUSCITATE. Daughter agreed no aggressive things should be done, but she would like her mother be treated with IVF and antibiotics tonight. If patient deteriorates,  her daughter would be okay to let pt go comfort care.   DVT ppx: SQ Lovenox  Code Status: DNR Family Communication: Yes, patient's daugher  at bed side Disposition Plan: Admit to inpatient   Date of Service 02-06-16    Lorretta Harp [facility] Pager [phone]  If 7PM-7AM, please contact night-coverage [email or web address] Password [email or web address] 02/06/2016, 3:29 AM

## 2016-01-14 NOTE — ED Notes (Signed)
Admitting at bedside 

## 2016-01-14 NOTE — Procedures (Signed)
Entered pt's room to check alarming BiPAP.  Pt SpO2-86, increased RR, decreased volumes, BiPAP not ventilating due to excessive leak.  Pt removed from BiPAP and placed on NRB.  Pt's SpO2-decreased to 83. Rales and Rhonchi noted.  NTS pt's left nostril and 15 mL of tan, thick, pink tinged secretions obtain.  Barrier placed on the bridge of nose because breakdown noted.  Pt placed back on BiPAP and Dr. Youlanda Roys notified.

## 2016-01-14 NOTE — ED Notes (Signed)
Pt comes from [facility] via [facility], called to home for resp distress, rales noted upon EMS arrival stats in 70's On room air , placed on CPAP stats remained in 70's

## 2016-01-14 NOTE — Progress Notes (Signed)
Pt arrived to 3s03 around 6am with notable respiratory distress. SpO2 88-90% on BiPap 100%, PEEP 8 with rate of 12. Pt continually tugging and pulling at mask while repeating "I want this off". Daughter Bonita Quin at bedside and stating both she and her mother want her comfortable and this "mask" (reffering to BiPap) is not what they wish. RN discussed that eminent death was a possibility if we removed BiPap and placed patient on NRB at 15L. Daughter continued to state that she "just wants her mother comfortable" and they have no intentions of intubating her. T.Calahan, NP paged and discussed family/patient wishes. New orders received and noted. RN will continue to monitor.

## 2016-01-14 NOTE — Progress Notes (Signed)
ANTIBIOTIC CONSULT NOTE - INITIAL  Pharmacy Consult for vancomycin Indication: rule out pneumonia  No Known Allergies  Patient Measurements: Height:  (144.8 cm) Weight: 79 lb (35.834 kg) IBW/kg (Calculated) : 38.6  Vital Signs: Temp: 98.8 F (37.1 C) (01/29 0253) Temp Source: Rectal (01/29 0253) BP: 122/71 mmHg (01/29 0145) Pulse Rate: 95 (01/29 0200)  Labs:  Recent Labs  02/08/16 0206 08-Feb-2016 0220  WBC 20.3*  --   HGB 13.5 15.6*  PLT 148*  --   CREATININE 0.87 0.80   Estimated Creatinine Clearance: 20.6 mL/min (by C-G formula based on Cr of 0.8).  Medical History: Past Medical History  Diagnosis Date  . Thyroid disease   . Hypertension   . CHF (congestive heart failure) (HCC)   . Aortic stenosis   . Low back pain   . Hyperlipidemia   . Anxiety   . Depression   . Osteoporosis   . Anemia   . Presence of permanent cardiac pacemaker     Assessment: 82yo female presents from SNF w/ respiratory distress, O2 sat in 70s even after CPAP placed, CXR shows interstitial edema vs PNA, to begin IV ABX.  Goal of Therapy:  Vancomycin trough level 15-20 mcg/ml  Plan:  Rec'd vanc 1g IV in ED; will continue with vancomycin  IV Q48H and monitor CBC, Cx, levels prn; will also change cefepime to 1g IV Q24H for renal adjustment.  Vernard Gambles, PharmD, BCPS  08-Feb-2016,3:30 AM

## 2016-01-14 NOTE — Discharge Summary (Addendum)
Death Summary  [name] NFA:[id number] DOB: December 29, 1912 DOA: 26-Jan-2016  PCP: Julian Hy, MD PCP/Office notified: Forwarded death summary.  Admit date: 01-26-2016 Date of Death: 2016/01/26  Final Diagnoses:  Principal Problem:   Acute respiratory failure with hypoxia (HCC) Active Problems:   Hypothyroidism   Anxiety state   Chronic diastolic (congestive) heart failure (HCC)   Depression   NSTEMI (non-ST elevated myocardial infarction) (HCC)   Sepsis (HCC)   HCAP (healthcare-associated pneumonia)   Hypokalemia   Diastolic CHF, acute on chronic [facility])      History of present illness & Hospital course:  82 year old pleasant female patient, SNF resident, predominantly bedbound and moved around with the help of a wheelchair and occasionally/once a week ambulated with the help of a walker, PMH of hypothyroid, HTN, chronic diastolic CHF, aortic stenosis, HLD, PPM, anxiety & depression was transferred to [facility] ED on 01/26/16 with complaints of worsening dyspnea. As per daughter, her symptoms started at approximately 7 PM on the day of ED visit and she was told by SNF nursing staff that patient's fingers were "turning blue". EMS found patient lethargic with oxygen saturation of 70% on room air. In the ED, workup revealed WBC 20.3, troponin 0.11, lactate level V.15, BNP 3427, normal temperature, tachycardia, tachypnea and hypoxic at 74% on room air. Chest x-ray revealed small bilateral pleural effusions, vascular congestion, mild cardiomegaly and bibasilar airspace opacities that could reflect mild interstitial edema or possibly pneumonia. Sepsis pathway was initiated for presumed healthcare associated pneumonia and she was started empirically on IV vancomycin and Zosyn. She did receive IV fluid bolus in the ED and started on small amount of maintenance IV fluids. Elevated troponin could have been secondary to demand ischemia from acute illness versus non-ST elevation MI. Her critical illness  was discussed at length by admitting physician with patient's daughter/healthcare power of attorney. DO NOT RESUSCITATE was confirmed. Daughter advised ration to be treated with IV fluids and antibiotics. BiPAP was placed briefly which the patient did not tolerate and both patient and daughter declined BiPAP. I went by to see the patient at approximately 7:45 AM. Patient was nonverbal and unable to give any history. She was somnolent but briefly open her eyes. She looked extremely ill. Small built, frail and chronically ill-looking elderly female (obvious adult failure to thrive). She was mouth breathing and had been placed on 100% nonrebreather mask. Accessory respiratory muscles were active. She remained hypoxic despite oxygen. Telemetry showed SR-ST in the 100s with BBB morphology and occasional PVCs. RS-gurgling and wet sounding breath sounds were decreased breath sounds bilaterally. No significant wheezing or rhonchi. CVS-S1 and S2 heard, RRR. Positive JVD. No pedal edema or murmurs. Abdomen-nondistended, soft and nontender. CNS: Somnolent, briefly opens eyes to call but nonverbal and did not follow instructions. Her acute hypoxic respiratory failure was probably secondary to acute on chronic diastolic CHF +/- healthcare associated pneumonia complicated by demand ischemia versus non-ST elevation MI. Discussed in detail with patient's daughter/healthcare power of attorney Ms. [name] at bedside. Updated critical condition of patient and extremely poor prognosis. Outlined options of continued aggressive radical care including antibiotics and IV Lasix (DO NOT RESUSCITATE and did not want BiPAP) versus transition to full comfort care. Bonita Quin wish to try a dose of IV Lasix but at the same time wanted patient to be comfortable. Made patient nothing by mouth and discontinued all oral medications. Started a dose of IV Lasix. Adjusted IV morphine for comfort. Added Ativan when necessary for agitation. Palliative  care was consulted. Shortly thereafter, received call from patient's RN stating that patient demised at 9:15 AM. Completed death certificate.    Time: 40 minutes.  Signed:  Kimberely Mccannon  [facility] 2016-01-28, 11:14 AM    Addendum  Responding to CDI Query by Servando Snare, CDI specialist.  Tracheal aspirate culture shows H Influenza. She could possibly have had H Influenza HCAP.  Marcellus Scott, MD, FACP, FHM. [facility] Pager [phone]  If 7PM-7AM, please contact night-coverage [email or web address] Password [email or web address] 01/25/2016, 4:57 PM

## 2016-01-14 NOTE — Progress Notes (Signed)
Pt expired at 0915 this nurse and Willeen Niece confirmed death. Dr Waymon Amato notified. Daughter present at bedside

## 2016-01-14 NOTE — ED Notes (Signed)
Per Dr. Youlanda Roys who spoke with family, pt is comfort care and a DNR, states dropped to 85 on BiPAP, if BP drops or LOC then all resuscitative measures will discontinue. Maintain stats to the best of ability without excessive force, while keeping patient comfortable.

## 2016-01-14 NOTE — ED Provider Notes (Signed)
CSN: [id number]     Arrival date & time 2016-01-24  0119 History  By signing my name below, I, Evelene Croon, attest that this documentation has been prepared under the direction and in the presence of Everlene Balls, MD . Electronically Signed: Evelene Croon, Scribe. 01-24-2016. 1:36 AM.    Chief Complaint  Patient presents with  . Respiratory Distress   LEVEL 5 CAVEAT DUE TO ACUITY OF MEDICAL CONDITION  The history is provided by the EMS personnel. No language interpreter was used.   HPI Comments:  [name] is a 82 y.o. female who presents to the Emergency Department via ambulance from [facility] for respiratory distress.  Per EMS pt was found lethargic and listless by NH staff with O2 saturation in the 70s on RA. She was placed on NRB/3L for her SOB. EMS reports audible rales; placed pt on CPAP with improvement. EMS unsure of last known well.  Pt is a DNR who consents to comfort measures.   Past Medical History  Diagnosis Date  . Thyroid disease   . Hypertension   . CHF (congestive heart failure) ([place])   . Aortic stenosis   . Low back pain   . Hyperlipidemia   . Anxiety   . Depression   . Osteoporosis   . Anemia   . Presence of permanent cardiac pacemaker    Past Surgical History  Procedure Laterality Date  . Pacemaker insertion    . Hip replace    . Insert / replace / remove pacemaker    . Total hip arthroplasty     Family History  Problem Relation Age of Onset  . Family history unknown: Yes   Social History  Substance Use Topics  . Smoking status: Former Research scientist (life sciences)  . Smokeless tobacco: Never Used  . Alcohol Use: No   OB History    No data available     Review of Systems  Unable to perform ROS: Acuity of condition    Allergies  Review of patient's allergies indicates no known allergies.  Home Medications   Prior to Admission medications   Medication Sig Start Date End Date Taking? Authorizing Provider  albuterol (PROVENTIL) (2.5 MG/3ML) 0.083% nebulizer  solution Take 2.5 mg by nebulization every 6 (six) hours as needed for wheezing or shortness of breath.    Historical Provider, MD  amLODipine (NORVASC) 5 MG tablet Take 5 mg by mouth daily.      Historical Provider, MD  feeding supplement, GLUCERNA SHAKE, (GLUCERNA SHAKE) LIQD Take 237 mLs by mouth 2 (two) times daily between meals.    Historical Provider, MD  furosemide (LASIX) 20 MG tablet Take 1 tablet (20 mg total) by mouth daily as needed for fluid. 11/13/15   Costin Karlyne Greenspan, MD  levofloxacin (LEVAQUIN) 500 MG tablet Take 1 tablet (500 mg total) by mouth daily. 11/13/15   Costin Karlyne Greenspan, MD  levothyroxine (SYNTHROID, LEVOTHROID) 75 MCG tablet Take 75 mcg by mouth daily before breakfast.    Historical Provider, MD  lisinopril (PRINIVIL,ZESTRIL) 20 MG tablet Take 20 mg by mouth 2 (two) times daily.      Historical Provider, MD  Melatonin 3 MG CAPS Take 3 mg by mouth at bedtime as needed (sleep).    Historical Provider, MD  metoprolol tartrate (LOPRESSOR) 25 MG tablet Take 25 mg by mouth 2 (two) times daily.      Historical Provider, MD  mirtazapine (REMERON) 15 MG tablet Take 15 mg by mouth at bedtime.    Historical  Provider, MD  Multiple Vitamins-Minerals (PRESERVISION AREDS 2 PO) Take 1 capsule by mouth 2 (two) times daily.    Historical Provider, MD  polyethylene glycol (MIRALAX / GLYCOLAX) packet Take 17 g by mouth daily as needed for mild constipation or moderate constipation.    Historical Provider, MD  Polyvinyl Alcohol-Povidone (FRESHKOTE) 2.7-2 % SOLN Apply 1 drop to eye 2 (two) times daily.    Historical Provider, MD  promethazine (PHENERGAN) 25 MG tablet Take 25 mg by mouth every 6 (six) hours as needed for nausea or vomiting.  09/09/15   Historical Provider, MD  sennosides-docusate sodium (SENOKOT-S) 8.6-50 MG tablet Take 1 tablet by mouth daily.      Historical Provider, MD   BP 165/54 mmHg  Pulse 101  Resp 34  Ht 4' 9"  (1.448 m)  Wt 79 lb (35.834 kg)  BMI 17.09 kg/m2  SpO2  74% Physical Exam  Constitutional: She appears well-developed and well-nourished. She appears distressed.  HENT:  Head: Normocephalic and atraumatic.  Nose: Nose normal.  Mouth/Throat: Oropharynx is clear and moist. No oropharyngeal exudate.  Eyes: Conjunctivae and EOM are normal. Pupils are equal, round, and reactive to light. No scleral icterus.  Neck: Normal range of motion. Neck supple. No JVD present. No tracheal deviation present. No thyromegaly present.  Cardiovascular: Regular rhythm and normal heart sounds.  Tachycardia present.  Exam reveals no gallop and no friction rub.   No murmur heard. Pulmonary/Chest: Accessory muscle usage present. Tachypnea noted. She is in respiratory distress. She has no wheezes. She has rales. She exhibits no tenderness.  Crackles bilateral lungs On BiPAP Increased work of breathing and use of accessory muscles Pacemaker left chest wall  Abdominal: Soft. Bowel sounds are normal. She exhibits no distension and no mass. There is no tenderness. There is no rebound and no guarding.  Musculoskeletal: Normal range of motion. She exhibits no edema or tenderness.  Lymphadenopathy:    She has no cervical adenopathy.  Neurological: She is alert. No cranial nerve deficit. She exhibits normal muscle tone.  Skin: Skin is warm and dry. No rash noted. No erythema. No pallor.  Nursing note and vitals reviewed.   ED Course  Procedures   DIAGNOSTIC STUDIES:  Oxygen Saturation is 74% on room air, low by my interpretation.     Labs Review Labs Reviewed  CBC WITH DIFFERENTIAL/PLATELET - Abnormal; Notable for the following:    WBC 20.3 (*)    Platelets 148 (*)    All other components within normal limits  I-STAT CG4 LACTIC ACID, ED - Abnormal; Notable for the following:    Lactic Acid, Venous 5.15 (*)    All other components within normal limits  I-STAT TROPOININ, ED - Abnormal; Notable for the following:    Troponin i, poc 0.11 (*)    All other components  within normal limits  I-STAT CHEM 8, ED - Abnormal; Notable for the following:    Potassium 3.4 (*)    Chloride 94 (*)    BUN 32 (*)    Glucose, Bld 219 (*)    Hemoglobin 15.6 (*)    All other components within normal limits  I-STAT ARTERIAL BLOOD GAS, ED - Abnormal; Notable for the following:    pH, Arterial 7.300 (*)    pCO2 arterial 54.2 (*)    Bicarbonate 26.6 (*)    All other components within normal limits  CULTURE, BLOOD (ROUTINE X 2)  CULTURE, BLOOD (ROUTINE X 2)  URINE CULTURE  COMPREHENSIVE METABOLIC PANEL  LIPASE, BLOOD  BLOOD GAS, ARTERIAL  MAGNESIUM  BRAIN NATRIURETIC PEPTIDE  URINALYSIS, ROUTINE W REFLEX MICROSCOPIC (NOT AT [facility])    Imaging Review Dg Chest Port 1 View  01/29/16  CLINICAL DATA:  Acute onset of shortness of breath. Pale in appearance, with lethargy. Initial encounter. EXAM: PORTABLE CHEST 1 VIEW COMPARISON:  Chest radiograph performed 11/11/2015 FINDINGS: The lungs are well-aerated. Small bilateral pleural effusions are noted. Vascular congestion is seen. Bibasilar airspace opacities may reflect mild interstitial edema or possibly pneumonia. There is no evidence of pneumothorax. The cardiomediastinal silhouette is mildly enlarged. A pacemaker is noted overlying the left chest wall, with leads ending overlying the right atrium and right ventricle. No acute osseous abnormalities are seen. IMPRESSION: Small bilateral pleural effusions noted. Vascular congestion and mild cardiomegaly seen. Bibasilar airspace opacities may reflect mild interstitial edema or possibly pneumonia. Electronically Signed   By: Garald Balding M.D.   On: 01-29-2016 02:25   I have personally reviewed and evaluated these images and lab results as part of my medical decision-making.   EKG Interpretation   Date/Time:  01/29/2016 01:29:41 EST Ventricular Rate:  109 PR Interval:  186 QRS Duration: 128 QT Interval:  379 QTC Calculation: 510 R Axis:   -48 Text  Interpretation:  Sinus tachycardia Left bundle branch block no  scarbossa criteria met No significant change since last tracing Confirmed  by Glynn Octave [id number]) on Jan 29, 2016 1:36:11 AM      MDM   Final diagnoses:  None   Patient presents to the ED for SOB all day.  Initial oxygen in the ED was 74% on room air.  Patient immediately placed on bipap for treatment.  Code sepsis was called and patient given HCAP coverage.  She is DNR/DNI.  Will admit to hospitalist to step down for further care. Lactate is 5.15, trop 0.11, WBC 20.3.  Patient is in critical condition.  Dr. Porfirio Mylar accepts patient to step down unit.    CRITICAL CARE Performed by: Everlene Balls, MD Total critical care time: 35 minutes - respiratory distress requiring bipap Critical care time was exclusive of separately billable procedures and treating other patients. Critical care was necessary to treat or prevent imminent or life-threatening deterioration. Critical care was time spent personally by me on the following activities: development of treatment plan with patient and/or surrogate as well as nursing, discussions with consultants, evaluation of patient's response to treatment, examination of patient, obtaining history from patient or surrogate, ordering and performing treatments and interventions, ordering and review of laboratory studies, ordering and review of radiographic studies, pulse oximetry and re-evaluation of patient's condition.  I personally performed the services described in this documentation, which was scribed in my presence. The recorded information has been reviewed and is accurate.      Everlene Balls, MD 01-29-16 [phone]

## 2016-01-14 NOTE — Progress Notes (Signed)
Utilization review completed.  

## 2016-01-14 NOTE — Progress Notes (Signed)
PT Cancellation Note  Patient Details Name: ILEY DEIGNAN MRN: [id number] DOB: [birth year]   Cancelled Treatment:    Reason Eval/Treat Not Completed: Other (comment).  Per discussion w/ RN pt has transitioned to end of life comfort care.  PT is signing off.  Michail Jewels PT, DPT [phone] Pager: [phone] 02-05-2016, 9:17 AM

## 2016-01-14 DEATH — deceased

## 2016-01-16 LAB — CULTURE, BLOOD (SINGLE): CULTURE: NO GROWTH
